# Patient Record
Sex: Female | Born: 1980 | Race: White | Hispanic: No | Marital: Married | Smoking: Former smoker
Health system: Southern US, Community
[De-identification: ages and names within clinical notes are randomized; demographics above are authoritative.]

## PROBLEM LIST (undated history)

## (undated) ENCOUNTER — Telehealth

## (undated) ENCOUNTER — Encounter

## (undated) ENCOUNTER — Ambulatory Visit: Payer: PRIVATE HEALTH INSURANCE

## (undated) ENCOUNTER — Ambulatory Visit: Payer: PRIVATE HEALTH INSURANCE | Attending: Family | Primary: Family

## (undated) ENCOUNTER — Ambulatory Visit

## (undated) ENCOUNTER — Encounter: Attending: Obstetrics & Gynecology | Primary: Obstetrics & Gynecology

## (undated) ENCOUNTER — Ambulatory Visit: Payer: PRIVATE HEALTH INSURANCE | Attending: Social Worker | Primary: Social Worker

## (undated) ENCOUNTER — Encounter: Attending: Podiatrist | Primary: Podiatrist

## (undated) ENCOUNTER — Encounter: Payer: PRIVATE HEALTH INSURANCE | Attending: Obstetrics & Gynecology | Primary: Obstetrics & Gynecology

## (undated) ENCOUNTER — Encounter: Payer: PRIVATE HEALTH INSURANCE | Attending: Clinical | Primary: Clinical

## (undated) ENCOUNTER — Ambulatory Visit: Attending: Family Medicine | Primary: Family Medicine

## (undated) ENCOUNTER — Telehealth: Attending: Podiatrist | Primary: Podiatrist

## (undated) ENCOUNTER — Encounter: Attending: Family | Primary: Family

## (undated) ENCOUNTER — Encounter: Attending: Anesthesiology | Primary: Anesthesiology

## (undated) ENCOUNTER — Ambulatory Visit: Payer: BLUE CROSS/BLUE SHIELD

## (undated) ENCOUNTER — Ambulatory Visit: Attending: Social Worker | Primary: Social Worker

## (undated) ENCOUNTER — Encounter: Payer: PRIVATE HEALTH INSURANCE | Attending: Medical | Primary: Medical

## (undated) ENCOUNTER — Ambulatory Visit: Payer: PRIVATE HEALTH INSURANCE | Attending: Medical | Primary: Medical

## (undated) ENCOUNTER — Telehealth: Attending: Clinical | Primary: Clinical

## (undated) ENCOUNTER — Ambulatory Visit: Payer: PRIVATE HEALTH INSURANCE | Attending: Podiatrist | Primary: Podiatrist

## (undated) ENCOUNTER — Ambulatory Visit: Payer: BLUE CROSS/BLUE SHIELD | Attending: Family Medicine | Primary: Family Medicine

## (undated) ENCOUNTER — Telehealth
Attending: Student in an Organized Health Care Education/Training Program | Primary: Student in an Organized Health Care Education/Training Program

## (undated) ENCOUNTER — Telehealth: Attending: Obstetrics & Gynecology | Primary: Obstetrics & Gynecology

## (undated) ENCOUNTER — Encounter: Attending: Social Worker | Primary: Social Worker

## (undated) ENCOUNTER — Encounter: Attending: Clinical | Primary: Clinical

## (undated) ENCOUNTER — Ambulatory Visit
Attending: Student in an Organized Health Care Education/Training Program | Primary: Student in an Organized Health Care Education/Training Program

## (undated) ENCOUNTER — Encounter: Payer: PRIVATE HEALTH INSURANCE | Attending: Podiatrist | Primary: Podiatrist

## (undated) DIAGNOSIS — F419 Anxiety disorder, unspecified: Secondary | ICD-10-CM

## (undated) DIAGNOSIS — M549 Dorsalgia, unspecified: Secondary | ICD-10-CM

## (undated) DIAGNOSIS — G8929 Other chronic pain: Secondary | ICD-10-CM

## (undated) HISTORY — PX: SPLENECTOMY, TOTAL: SHX788

---
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
## 2012-08-13 DIAGNOSIS — Z98891 History of uterine scar from previous surgery: Secondary | ICD-10-CM | POA: Insufficient documentation

## 2013-07-30 DIAGNOSIS — F419 Anxiety disorder, unspecified: Secondary | ICD-10-CM | POA: Insufficient documentation

## 2013-08-06 ENCOUNTER — Ambulatory Visit: Payer: Self-pay | Admitting: Physician Assistant

## 2014-03-17 ENCOUNTER — Ambulatory Visit: Payer: Self-pay

## 2014-03-17 LAB — PREGNANCY, URINE: PREGNANCY TEST, URINE: NEGATIVE m[IU]/mL

## 2014-08-07 ENCOUNTER — Ambulatory Visit: Payer: Self-pay | Admitting: Podiatry

## 2014-08-08 ENCOUNTER — Ambulatory Visit: Payer: Self-pay | Admitting: Podiatry

## 2014-10-19 NOTE — Op Note (Signed)
PATIENT NAME:  Julie Leach, Julie Leach MR#:  [id number] DATE OF BIRTH:  [birth year]  DATE OF PROCEDURE:  08/08/2014  SURGEON:  Ricci Barkerodd W Khairi Garman, DPM   PREOPERATIVE DIAGNOSIS: Hallux valgus deformity, left foot.   POSTOPERATIVE DIAGNOSIS: Hallux valgus deformity, left foot.   PROCEDURE: Hallux valgus correction, Austin type, left foot.   ANESTHESIA: Local MAC.   HEMOSTASIS: Pneumatic tourniquet left ankle, 250 mmHg.   ESTIMATED BLOOD LOSS: Minimal.   MATERIALS: One 2.2 mm, 22 mm length Medartis screw.   PATHOLOGY: None.   COMPLICATIONS: None apparent.   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 She elects for surgical correction of her deformity.   OPERATIVE PROCEDURE: The patient was taken to the Operating Room and placed on the table in the supine position. Following satisfactory sedation, the left foot was anesthetized with 10 mL of 0.5% Sensorcaine plain around the first metatarsal. A pneumatic tourniquet was applied at the level of the left ankle and the foot was prepped and draped in the usual sterile fashion. The foot was exsanguinated and the tourniquet inflated to 250 mmHg.   Attention was then directed to the dorsal aspect of the left foot where an approximate 4 cm linear incision was made coursing proximal to distal over the distal first metatarsal and metatarsophalangeal joint. The incision was deepened via sharp and blunt dissection down to the level of the joint where a linear capsulotomy was performed. The capsular and periosteal tissues were reflected off of the medial and dorsal aspect of the head and neck of the first metatarsal. Using a pneumatic saw, the prominent medial eminence was resected in toto as well as the dorsal prominence. Next, a 0.045 inch Leach wire was then driven from medial to lateral as an axis guide and a V-shaped osteotomy was performed through the head of the first metatarsal and the capital  fragment was freely mobilized and the pin was removed. The head of the metatarsal was then mobilized laterally and fixated using an intraoperative pin. Intraoperative Fluoroscan views revealed good reduction of the deformity. Next, a 2.2 mm 22 mm length Medartis screw was inserted for fixation of the osteotomy in standard fashion. Intraoperative views again revealed good placement of the screw and alignment of the bone. The redundant medial eminence was resected using a pneumatic saw and the edges were rasped smooth. The wound was flushed with copious amounts of sterile saline and closed in layers using a 4-0 running Vicryl suture for all layers from periosteal to deep and superficial subcutaneous closure followed by skin closure. Tincture of benzoin and Steri-Strips were applied to the incision followed by 4 x 4's and a sterile bandage. Next, a posterior splint was applied to the left foot and ankle with the foot 90 degrees relative to the leg. The patient tolerated the procedure and anesthesia well and was transported to the PACU with vital signs stable and in good condition.      ____________________________ Linus Galasodd Sarea Fyfe, DPM tc:at D: 08/08/2014 16:46:34 ET T: 08/08/2014 20:19:19 ET JOB#: [id number]  cc: Linus Galasodd Bhavesh Vazquez, DPM, <Dictator> Jahmai Finelli DPM ELECTRONICALLY SIGNED 08/20/2014 9:10

## 2015-03-03 ENCOUNTER — Ambulatory Visit
Admission: EM | Admit: 2015-03-03 | Discharge: 2015-03-03 | Disposition: A | Discharge status: Home / Self Care | Payer: No Typology Code available for payment source | Attending: Family Medicine | Admitting: Family Medicine

## 2015-03-03 DIAGNOSIS — G4441 Drug-induced headache, not elsewhere classified, intractable: Secondary | ICD-10-CM

## 2015-03-03 DIAGNOSIS — K625 Hemorrhage of anus and rectum: Secondary | ICD-10-CM | POA: Diagnosis not present

## 2015-03-03 DIAGNOSIS — G4440 Drug-induced headache, not elsewhere classified, not intractable: Secondary | ICD-10-CM

## 2015-03-03 DIAGNOSIS — R1084 Generalized abdominal pain: Secondary | ICD-10-CM

## 2015-03-03 HISTORY — DX: Other chronic pain: G89.29

## 2015-03-03 HISTORY — DX: Dorsalgia, unspecified: M54.9

## 2015-03-03 LAB — COMPREHENSIVE METABOLIC PANEL
ALBUMIN: 4.5 g/dL (ref 3.5–5.0)
ALT: 15 U/L (ref 14–54)
AST: 21 U/L (ref 15–41)
Alkaline Phosphatase: 45 U/L (ref 38–126)
Anion gap: 8 (ref 5–15)
BUN: 12 mg/dL (ref 6–20)
CHLORIDE: 101 mmol/L (ref 101–111)
CO2: 26 mmol/L (ref 22–32)
CREATININE: 0.6 mg/dL (ref 0.44–1.00)
Calcium: 9 mg/dL (ref 8.9–10.3)
GFR calc Af Amer: >60 mL/min (ref >60)
GFR calc non Af Amer: >60 mL/min (ref >60)
GLUCOSE: 90 mg/dL (ref 65–99)
POTASSIUM: 3.9 mmol/L (ref 3.5–5.1)
SODIUM: 135 mmol/L (ref 135–145)
Total Bilirubin: 0.4 mg/dL (ref 0.3–1.2)
Total Protein: 7.4 g/dL (ref 6.5–8.1)

## 2015-03-03 LAB — CBC WITH DIFFERENTIAL/PLATELET
BASOS ABS: 0.2 10*3/uL — AB (ref 0–0.1)
BASOS PCT: 1 %
EOS ABS: 0.6 10*3/uL (ref 0–0.7)
EOS PCT: 4 %
HCT: 40 % (ref 35.0–47.0)
Hemoglobin: 13.5 g/dL (ref 12.0–16.0)
Lymphocytes Relative: 29 %
Lymphs Abs: 4.3 10*3/uL — ABNORMAL HIGH (ref 1.0–3.6)
MCH: 31.2 pg (ref 26.0–34.0)
MCHC: 33.7 g/dL (ref 32.0–36.0)
MCV: 92.4 fL (ref 80.0–100.0)
MONO ABS: 1.4 10*3/uL — AB (ref 0.2–0.9)
Monocytes Relative: 9 %
Neutro Abs: 8.7 10*3/uL — ABNORMAL HIGH (ref 1.4–6.5)
Neutrophils Relative %: 57 %
PLATELETS: 386 10*3/uL (ref 150–440)
RBC: 4.33 MIL/uL (ref 3.80–5.20)
RDW: 13.5 % (ref 11.5–14.5)
WBC: 15.3 10*3/uL — ABNORMAL HIGH (ref 3.6–11.0)

## 2015-03-03 LAB — OCCULT BLOOD X 1 CARD TO LAB, STOOL: Fecal Occult Bld: NEGATIVE

## 2015-03-03 MED ORDER — ONDANSETRON HCL 4 MG/2ML IJ SOLN: 8.0000 mg | Freq: Once | INTRAMUSCULAR | Status: DC

## 2015-03-03 MED ORDER — KETOROLAC TROMETHAMINE 60 MG/2ML IM SOLN
60.0000 mg | Freq: Once | INTRAMUSCULAR | Status: AC
  Administered 2015-03-03: 60 mg via INTRAMUSCULAR

## 2015-03-03 MED ORDER — ONDANSETRON 8 MG PO TBDP
8.0000 mg | ORAL_TABLET | Freq: Once | ORAL | Status: AC
  Administered 2015-03-03: 8 mg via ORAL

## 2015-03-03 MED ORDER — CELECOXIB 400 MG PO CAPS: 400.0000 mg | ORAL_CAPSULE | Freq: Every day | ORAL | Status: DC

## 2015-03-03 MED ORDER — GI COCKTAIL ~~LOC~~
30.0000 mL | Freq: Once | ORAL | Status: AC
  Administered 2015-03-03: 30 mL via ORAL

## 2015-03-03 MED ORDER — ONDANSETRON 8 MG PO TBDP: 8.0000 mg | ORAL_TABLET | Freq: Three times a day (TID) | ORAL | Status: DC | PRN

## 2015-03-03 MED ORDER — SUCRALFATE 1 G PO TABS: 2.0000 g | ORAL_TABLET | Freq: Two times a day (BID) | ORAL | Status: DC

## 2015-03-03 NOTE — Discharge Instructions (Signed)
Abdominal Pain Many things can cause belly (abdominal) pain. Most times, the belly pain is not dangerous. Many cases of belly pain can be watched and treated at home. HOME CARE   Do not take medicines that help you go poop (laxatives) unless told to by your doctor.  Only take medicine as told by your doctor.  Eat or drink as told by your doctor. Your doctor will tell you if you should be on a special diet. GET HELP IF:  You do not know what is causing your belly pain.  You have belly pain while you are sick to your stomach (nauseous) or have runny poop (diarrhea).  You have pain while you pee or poop.  Your belly pain wakes you up at night.  You have belly pain that gets worse or better when you eat.  You have belly pain that gets worse when you eat fatty foods.  You have a fever. GET HELP RIGHT AWAY IF:   The pain does not go away within 2 hours.  You keep throwing up (vomiting).  The pain changes and is only in the right or left part of the belly.  You have bloody or tarry looking poop. MAKE SURE YOU:   Understand these instructions.  Will watch your condition.  Will get help right away if you are not doing well or get worse. Document Released: 11/23/2007 Document Revised: 06/11/2013 Document Reviewed: 02/13/2013 [facility] Patient Information 2015 [place], [place]. This information is not intended to replace advice given to you by your health care provider. Make sure you discuss any questions you have with your health care provider.  Gastrointestinal Bleeding Gastrointestinal bleeding is bleeding somewhere along the path that food travels through the body (digestive tract). This path is anywhere between the mouth and the opening of the butt (anus). You may have blood in your throw up (vomit) or in your poop (stools). If there is a lot of bleeding, you may need to stay in the hospital. HOME CARE  Only take medicine as told by your doctor.  Eat foods with fiber such as  whole grains, fruits, and vegetables. You can also try eating 1 to 3 prunes a day.  Drink enough fluids to keep your pee (urine) clear or pale yellow. GET HELP RIGHT AWAY IF:   Your bleeding gets worse.  You feel dizzy, weak, or you pass out (faint).  You have bad cramps in your back or belly (abdomen).  You have large blood clumps (clots) in your poop.  Your problems are getting worse. MAKE SURE YOU:   Understand these instructions.  Will watch your condition.  Will get help right away if you are not doing well or get worse. Document Released: 03/15/2008 Document Revised: 05/23/2012 Document Reviewed: 05/16/2011 [facility] Patient Information 2015 [place], [place]. This information is not intended to replace advice given to you by your health care provider. Make sure you discuss any questions you have with your health care provider.  Bloody Stools Bloody stools means there is blood in your poop (stool). It is a sign that there is a problem somewhere in the digestive system. It is important for your doctor to find the cause of your bleeding, so the problem can be treated.  HOME CARE  Only take medicine as told by your doctor.  Eat foods with fiber (prunes, bran cereals).  Drink enough fluids to keep your pee (urine) clear or pale yellow.  Sit in warm water (sitz bath) for 10 to 15 minutes  as told by your doctor.  Know how to take your medicines (enemas, suppositories) if advised by your doctor.  Watch for signs that you are getting better or getting worse. GET HELP RIGHT AWAY IF:   You are not getting better.  You start to get better but then get worse again.  You have new problems.  You have severe bleeding from the place where poop comes out (rectum) that does not stop.  You throw up (vomit) blood.  You feel weak or pass out (faint).  You have a fever. MAKE SURE YOU:   Understand these instructions.  Will watch your condition.  Will get help right away if  you are not doing well or get worse. Document Released: 05/25/2009 Document Revised: 08/29/2011 Document Reviewed: 10/22/2010 [facility] Patient Information 2015 [place], [place]. This information is not intended to replace advice given to you by your health care provider. Make sure you discuss any questions you have with your health care provider.

## 2015-03-03 NOTE — ED Notes (Signed)
Pt states "I have chronic back pain and take several different meds. I added the naproxen back to my regimen with Diclofenac. Today at 2 pm I had bad bowel cramping and a big bright red bloody stool. I called my PCP and they instructed me to go to the ED, but it was closer for me to come here to the urgent care." Pt instructed on differences in urgent care and ED, and the levels of care each can provide.

## 2015-03-03 NOTE — ED Provider Notes (Signed)
CSN: [id number]     Arrival date & time 03/03/15  1853 History   First MD Initiated Contact with Patient 03/03/15 2108     Chief Complaint  Patient presents with  . Rectal Bleeding   patient states that she started having rectal bleeding this afternoon. She has history of back pain back trouble and she's been placed on Naprosyn which has not been effective she had Voltaren added to her regimen. When the Voltaren failed to help she started taking the Naprosyn with the Voltaren. She states she started having cramping this afternoon went to the bathroom had what she thought was going to be a diarrhea stool but was only blood and a significant amount blood that came out of her rectum. She called her PCP who recommended her go to the ED but instead she came here for evaluation and treatment. She has history of chronic back pain is on other medications for that as well. She states that she doesn't want to have another bowel movement right now place been having cramping sensation all over her abdomen. She had a history of anemia years ago was placed on but does know what her hemoglobin hematocrit is now always been in the past when she was anemic. (Consider location/radiation/quality/duration/timing/severity/associated sxs/prior Treatment) Patient is a 34 y.o. female presenting with hematochezia. The history is provided by the patient. No language interpreter was used.  Rectal Bleeding Quality:  Bright red Amount:  Moderate Progression:  Unchanged Chronicity:  New Context: defecation and spontaneously   Context: not anal fissures, not anal penetration, not diarrhea, not foreign body, not hemorrhoids, not rectal injury and not rectal pain   Similar prior episodes: no   Relieved by:  Nothing Ineffective treatments:  None tried Associated symptoms: abdominal pain, dizziness, epistaxis and recent illness   Associated symptoms: no fever, no hematemesis, no light-headedness, no loss of consciousness and no  vomiting   Risk factors: NSAID use   Risk factors: no anticoagulant use, no hx of IBD and no steroid use     Past Medical History  Diagnosis Date  . Chronic back pain    Past Surgical History  Procedure Laterality Date  . Cesarean section    . Splenectomy, total     No family history on file. Social History  Substance Use Topics  . Smoking status: Never Smoker   . Smokeless tobacco: None  . Alcohol Use: Yes   OB History    No data available     Review of Systems  Constitutional: Negative for fever and activity change.  HENT: Positive for nosebleeds.   Gastrointestinal: Positive for abdominal pain, blood in stool, hematochezia and abdominal distention. Negative for nausea, vomiting, diarrhea, constipation, rectal pain and hematemesis.  Musculoskeletal: Positive for myalgias and back pain. Negative for neck pain and neck stiffness.  Neurological: Positive for dizziness. Negative for loss of consciousness and light-headedness.  All other systems reviewed and are negative.  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 She is also has C-sections. She does not smoke but she does drink alcohol.  Allergies  Mobic and Morphine and related  Home Medications   Prior to Admission medications   Medication Sig Start Date End Date Taking? Authorizing Provider  diclofenac (VOLTAREN) 50 MG EC tablet Take 50 mg by mouth 2 (two) times daily.   Yes Historical Provider, MD  gabapentin (NEURONTIN) 300 MG capsule Take 300 mg by mouth 3 (three) times daily.   Yes Historical Provider, MD  naproxen (EC NAPROSYN) 500 MG EC tablet Take 500 mg by mouth 2 (two) times daily with a meal.   Yes Historical Provider, MD  sertraline (ZOLOFT) 100 MG tablet Take 100 mg by mouth daily.   Yes Historical Provider, MD   Meds Ordered and Administered this Visit   Medications  gi cocktail (Maalox,Lidocaine,Donnatal) (30 mLs Oral Given 03/03/15 2128)    BP 149/86 mmHg  Pulse 67  Temp(Src) 98.2  F (36.8 C) (Oral)  Resp 16  Ht  (1.575 m)  Wt 150 lb (68.04 kg)  BMI 27.43 kg/m2  SpO2 100%  LMP 08/31/2014 (Exact Date) No data found.   Physical Exam  Constitutional: She is oriented to person, place, and time. She appears well-developed and well-nourished.  HENT:  Head: Normocephalic and atraumatic.  Eyes: Pupils are equal, round, and reactive to light.  Conjunctiva is very pale.  Neck: Normal range of motion. Neck supple. No tracheal deviation present. No thyromegaly present.  Cardiovascular: Normal rate, regular rhythm and normal heart sounds.  Exam reveals no friction rub.   No murmur heard. Pulmonary/Chest: Effort normal and breath sounds normal. No respiratory distress. She has no wheezes.  Abdominal: Soft. She exhibits no distension. Bowel sounds are decreased. There is no hepatomegaly. There is generalized tenderness. There is no rebound and no CVA tenderness.    Midline abdominal scar.  Genitourinary: Rectum normal. Rectal exam shows no external hemorrhoid, no internal hemorrhoid, no fissure, no mass, no tenderness and anal tone normal. Guaiac negative stool.  Musculoskeletal: Normal range of motion. She exhibits no edema or tenderness.  Neurological: She is alert and oriented to person, place, and time. She has normal reflexes.  Skin: Skin is warm and dry. No erythema.  Psychiatric: She has a normal mood and affect.  Vitals reviewed.   ED Course  Procedures (including critical care time)  Labs Review Labs Reviewed  CBC WITH DIFFERENTIAL/PLATELET - Abnormal; Notable for the following:    WBC 15.3 (*)    Neutro Abs 8.7 (*)    Lymphs Abs 4.3 (*)    Monocytes Absolute 1.4 (*)    Basophils Absolute 0.2 (*)    All other components within normal limits  COMPREHENSIVE METABOLIC PANEL  OCCULT BLOOD X 1 CARD TO LAB, STOOL    Imaging Review No results found.   Visual Acuity Review  Right Eye Distance:   Left Eye Distance:   Bilateral Distance:     Right Eye Near:   Left Eye Near:    Bilateral Near:         MDM   1. Rash   2. Viral disease characterized by exanthem     Conjunctiva was initially pale but hemoglobin and hematocrit stable. Explained to patient that I'll place on Carafate 2 tablets twice a day. I'm concerned about her taking both Naprosyn and Voltaren together so I'll recommend Celebrex 400 mg 1 capsule a day. Zofran 8 mg for nausea case it recurs work note for today and tomorrow. Explained patient at the rectal bleeding restarts she will need to go to the emergency room of her choice otherwise follow-up with her PCP in about a week.  Last an inflammatory was this morning she states that she's having trouble for back now and wants to which she can use for her back . All local pharmacies will be closed now. Will administer 60 mg Toradol IM for back pain and reiterate again to go to the ED if bleeding starts again.  Hassan Rowan, MD 03/03/15 2213

## 2015-03-13 ENCOUNTER — Other Ambulatory Visit: Payer: Self-pay | Admitting: Orthopedic Surgery

## 2015-03-13 DIAGNOSIS — M75101 Unspecified rotator cuff tear or rupture of right shoulder, not specified as traumatic: Secondary | ICD-10-CM

## 2015-03-13 DIAGNOSIS — M25511 Pain in right shoulder: Secondary | ICD-10-CM

## 2015-03-23 ENCOUNTER — Ambulatory Visit: Payer: No Typology Code available for payment source

## 2015-03-23 ENCOUNTER — Ambulatory Visit
Admission: RE | Admit: 2015-03-23 | Discharge: 2015-03-23 | Disposition: A | Discharge status: Home / Self Care | Payer: No Typology Code available for payment source | Source: Ambulatory Visit | Attending: Orthopedic Surgery | Admitting: Orthopedic Surgery

## 2015-03-23 DIAGNOSIS — M75101 Unspecified rotator cuff tear or rupture of right shoulder, not specified as traumatic: Secondary | ICD-10-CM | POA: Insufficient documentation

## 2015-03-23 DIAGNOSIS — M25511 Pain in right shoulder: Secondary | ICD-10-CM | POA: Insufficient documentation

## 2015-03-31 DIAGNOSIS — M75101 Unspecified rotator cuff tear or rupture of right shoulder, not specified as traumatic: Secondary | ICD-10-CM | POA: Insufficient documentation

## 2015-03-31 DIAGNOSIS — Z8739 Personal history of other diseases of the musculoskeletal system and connective tissue: Secondary | ICD-10-CM | POA: Insufficient documentation

## 2015-08-26 ENCOUNTER — Other Ambulatory Visit: Payer: Self-pay | Admitting: Pediatrics

## 2015-08-26 DIAGNOSIS — N63 Unspecified lump in unspecified breast: Secondary | ICD-10-CM

## 2015-09-11 ENCOUNTER — Ambulatory Visit
Admission: RE | Admit: 2015-09-11 | Discharge: 2015-09-11 | Disposition: A | Discharge status: Home / Self Care | Payer: BLUE CROSS/BLUE SHIELD | Source: Ambulatory Visit | Attending: Pediatrics | Admitting: Pediatrics

## 2015-09-11 DIAGNOSIS — N63 Unspecified lump in unspecified breast: Secondary | ICD-10-CM

## 2016-07-11 ENCOUNTER — Other Ambulatory Visit: Payer: BLUE CROSS/BLUE SHIELD

## 2016-07-15 ENCOUNTER — Ambulatory Visit: Admission: RE | Admit: 2016-07-15 | Payer: BLUE CROSS/BLUE SHIELD | Source: Ambulatory Visit | Admitting: Podiatry

## 2016-07-15 ENCOUNTER — Encounter: Admission: RE | Payer: Self-pay | Source: Ambulatory Visit

## 2016-07-15 SURGERY — CORRECTION, HALLUX VALGUS
Anesthesia: Choice | Laterality: Right

## 2016-10-04 DIAGNOSIS — L280 Lichen simplex chronicus: Secondary | ICD-10-CM | POA: Insufficient documentation

## 2016-10-04 DIAGNOSIS — G8929 Other chronic pain: Secondary | ICD-10-CM | POA: Insufficient documentation

## 2017-02-10 ENCOUNTER — Ambulatory Visit: Admission: RE | Admit: 2017-02-10 | Discharge: 2017-02-10 | Payer: PRIVATE HEALTH INSURANCE

## 2017-02-10 DIAGNOSIS — M5416 Radiculopathy, lumbar region: Principal | ICD-10-CM

## 2017-02-10 MED ORDER — PREDNISONE 20 MG TABLET
ORAL_TABLET | 0 refills | 0 days | Status: CP
Start: 2017-02-10 — End: 2017-04-05

## 2017-02-10 MED ORDER — HYDROCODONE 5 MG-ACETAMINOPHEN 325 MG TABLET
ORAL_TABLET | ORAL | 0 refills | 0.00000 days | Status: CP | PRN
Start: 2017-02-10 — End: 2017-04-05

## 2017-04-05 ENCOUNTER — Ambulatory Visit: Admission: RE | Admit: 2017-04-05 | Discharge: 2017-04-05 | Payer: PRIVATE HEALTH INSURANCE

## 2017-04-05 DIAGNOSIS — Z8742 Personal history of other diseases of the female genital tract: Principal | ICD-10-CM

## 2017-04-05 DIAGNOSIS — G8929 Other chronic pain: Secondary | ICD-10-CM

## 2017-04-05 DIAGNOSIS — Z9081 Acquired absence of spleen: Secondary | ICD-10-CM

## 2017-04-05 DIAGNOSIS — Z23 Encounter for immunization: Secondary | ICD-10-CM

## 2017-04-05 DIAGNOSIS — M25511 Pain in right shoulder: Secondary | ICD-10-CM

## 2017-04-12 ENCOUNTER — Ambulatory Visit: Admit: 2017-04-12 | Discharge: 2017-04-12 | Payer: BLUE CROSS/BLUE SHIELD

## 2017-04-12 DIAGNOSIS — G8929 Other chronic pain: Secondary | ICD-10-CM

## 2017-04-12 DIAGNOSIS — M7581 Other shoulder lesions, right shoulder: Principal | ICD-10-CM

## 2017-04-12 DIAGNOSIS — M25511 Pain in right shoulder: Secondary | ICD-10-CM

## 2017-07-05 ENCOUNTER — Encounter: Admit: 2017-07-05 | Discharge: 2017-07-06 | Payer: PRIVATE HEALTH INSURANCE

## 2017-07-05 DIAGNOSIS — R109 Unspecified abdominal pain: Secondary | ICD-10-CM

## 2017-07-05 DIAGNOSIS — Z975 Presence of (intrauterine) contraceptive device: Secondary | ICD-10-CM

## 2017-07-05 DIAGNOSIS — R110 Nausea: Principal | ICD-10-CM

## 2017-07-05 DIAGNOSIS — N920 Excessive and frequent menstruation with regular cycle: Secondary | ICD-10-CM

## 2017-07-05 MED ORDER — HYDROXYZINE HCL 25 MG TABLET
ORAL_TABLET | Freq: Three times a day (TID) | ORAL | 0 refills | 0.00000 days | Status: CP | PRN
Start: 2017-07-05 — End: 2017-07-31

## 2017-07-05 MED ORDER — ONDANSETRON 8 MG DISINTEGRATING TABLET
ORAL_TABLET | Freq: Every day | ORAL | 0 refills | 0.00000 days | Status: CP | PRN
Start: 2017-07-05 — End: 2018-01-04

## 2017-07-05 MED ORDER — GABAPENTIN 300 MG CAPSULE
ORAL_CAPSULE | Freq: Every evening | ORAL | 0 refills | 0 days | Status: CP
Start: 2017-07-05 — End: 2017-10-03

## 2017-07-05 MED ORDER — TRIAMCINOLONE ACETONIDE 0.1 % TOPICAL OINTMENT
Freq: Two times a day (BID) | TOPICAL | 2 refills | 0 days | Status: CP
Start: 2017-07-05 — End: 2018-07-05

## 2017-07-05 MED ORDER — SERTRALINE 100 MG TABLET
ORAL_TABLET | Freq: Every day | ORAL | 1 refills | 0.00000 days | Status: CP
Start: 2017-07-05 — End: 2018-01-10

## 2017-07-31 MED ORDER — HYDROXYZINE HCL 25 MG TABLET
ORAL_TABLET | Freq: Three times a day (TID) | ORAL | 0 refills | 0.00000 days | Status: CP | PRN
Start: 2017-07-31 — End: 2018-07-31

## 2017-08-03 ENCOUNTER — Encounter: Admit: 2017-08-03 | Discharge: 2017-08-04 | Payer: PRIVATE HEALTH INSURANCE | Attending: Family | Primary: Family

## 2017-08-03 DIAGNOSIS — M549 Dorsalgia, unspecified: Principal | ICD-10-CM

## 2017-08-03 DIAGNOSIS — R1011 Right upper quadrant pain: Secondary | ICD-10-CM

## 2017-08-03 DIAGNOSIS — R1031 Right lower quadrant pain: Secondary | ICD-10-CM

## 2017-10-03 ENCOUNTER — Ambulatory Visit: Admit: 2017-10-03 | Discharge: 2017-10-04 | Payer: PRIVATE HEALTH INSURANCE

## 2017-10-03 DIAGNOSIS — L709 Acne, unspecified: Secondary | ICD-10-CM

## 2017-10-03 DIAGNOSIS — R87619 Unspecified abnormal cytological findings in specimens from cervix uteri: Secondary | ICD-10-CM

## 2017-10-03 DIAGNOSIS — R109 Unspecified abdominal pain: Secondary | ICD-10-CM

## 2017-10-03 DIAGNOSIS — M2550 Pain in unspecified joint: Secondary | ICD-10-CM

## 2017-10-03 DIAGNOSIS — F419 Anxiety disorder, unspecified: Secondary | ICD-10-CM

## 2017-10-03 DIAGNOSIS — Z975 Presence of (intrauterine) contraceptive device: Principal | ICD-10-CM

## 2017-10-03 MED ORDER — RANITIDINE 150 MG TABLET
ORAL_TABLET | Freq: Two times a day (BID) | ORAL | 2 refills | 0 days | Status: CP
Start: 2017-10-03 — End: 2018-10-01

## 2017-10-03 MED ORDER — DOXYCYCLINE HYCLATE 100 MG CAPSULE
ORAL_CAPSULE | Freq: Every day | ORAL | 2 refills | 0.00000 days | Status: CP
Start: 2017-10-03 — End: 2017-10-13

## 2017-10-03 MED ORDER — GABAPENTIN 300 MG CAPSULE
ORAL_CAPSULE | 2 refills | 0 days | Status: CP
Start: 2017-10-03 — End: 2018-07-19

## 2017-10-03 MED ORDER — ADAPALENE 0.1 %-BENZOYL PEROXIDE 2.5 % TOPICAL GEL WITH PUMP
Freq: Every day | TOPICAL | 2 refills | 0.00000 days | Status: CP
Start: 2017-10-03 — End: 2017-10-13

## 2017-10-13 MED ORDER — TRETINOIN 0.01 % TOPICAL GEL
Freq: Every evening | TOPICAL | 0 refills | 0 days | Status: CP
Start: 2017-10-13 — End: 2018-01-15

## 2017-10-13 MED ORDER — CLINDAMYCIN 1.2 % (1 % BASE)-BENZOYL PEROXIDE 5 % TOPICAL GEL
2 refills | 0 days | Status: CP
Start: 2017-10-13 — End: 2018-11-19

## 2018-01-09 ENCOUNTER — Encounter: Admit: 2018-01-09 | Discharge: 2018-01-10 | Payer: PRIVATE HEALTH INSURANCE

## 2018-01-09 DIAGNOSIS — G8929 Other chronic pain: Secondary | ICD-10-CM

## 2018-01-09 DIAGNOSIS — Z9081 Acquired absence of spleen: Secondary | ICD-10-CM

## 2018-01-09 DIAGNOSIS — M549 Dorsalgia, unspecified: Secondary | ICD-10-CM

## 2018-01-09 DIAGNOSIS — R1110 Vomiting, unspecified: Secondary | ICD-10-CM

## 2018-01-09 DIAGNOSIS — F419 Anxiety disorder, unspecified: Principal | ICD-10-CM

## 2018-01-09 DIAGNOSIS — R110 Nausea: Secondary | ICD-10-CM

## 2018-01-09 DIAGNOSIS — K219 Gastro-esophageal reflux disease without esophagitis: Secondary | ICD-10-CM

## 2018-01-09 DIAGNOSIS — L709 Acne, unspecified: Secondary | ICD-10-CM

## 2018-01-09 MED ORDER — ONDANSETRON 8 MG DISINTEGRATING TABLET
ORAL_TABLET | Freq: Every day | ORAL | 2 refills | 0 days | Status: CP | PRN
Start: 2018-01-09 — End: 2018-11-05

## 2018-01-09 MED ORDER — GABAPENTIN 100 MG CAPSULE
ORAL_CAPSULE | 3 refills | 0 days | Status: CP
Start: 2018-01-09 — End: 2018-07-19

## 2018-01-12 MED ORDER — SERTRALINE 100 MG TABLET
ORAL_TABLET | Freq: Every day | ORAL | 1 refills | 0.00000 days | Status: CP
Start: 2018-01-12 — End: 2018-07-19

## 2018-01-15 ENCOUNTER — Ambulatory Visit: Admit: 2018-01-15 | Discharge: 2018-01-16 | Payer: PRIVATE HEALTH INSURANCE | Attending: Medical | Primary: Medical

## 2018-01-15 DIAGNOSIS — K219 Gastro-esophageal reflux disease without esophagitis: Secondary | ICD-10-CM

## 2018-01-15 DIAGNOSIS — R1110 Vomiting, unspecified: Secondary | ICD-10-CM

## 2018-01-15 DIAGNOSIS — R1310 Dysphagia, unspecified: Secondary | ICD-10-CM

## 2018-01-15 DIAGNOSIS — R110 Nausea: Principal | ICD-10-CM

## 2018-01-22 DIAGNOSIS — R1310 Dysphagia, unspecified: Principal | ICD-10-CM

## 2018-01-24 ENCOUNTER — Encounter: Admit: 2018-01-24 | Discharge: 2018-01-24 | Payer: PRIVATE HEALTH INSURANCE

## 2018-01-24 ENCOUNTER — Ambulatory Visit: Admit: 2018-01-24 | Discharge: 2018-01-24 | Payer: PRIVATE HEALTH INSURANCE

## 2018-01-24 DIAGNOSIS — R1310 Dysphagia, unspecified: Principal | ICD-10-CM

## 2018-01-25 ENCOUNTER — Encounter: Admit: 2018-01-25 | Discharge: 2018-01-26 | Payer: PRIVATE HEALTH INSURANCE

## 2018-01-25 DIAGNOSIS — R1310 Dysphagia, unspecified: Principal | ICD-10-CM

## 2018-01-25 DIAGNOSIS — K219 Gastro-esophageal reflux disease without esophagitis: Secondary | ICD-10-CM

## 2018-02-19 ENCOUNTER — Encounter
Admit: 2018-02-19 | Discharge: 2018-02-19 | Disposition: A | Discharge status: Home / Self Care | Payer: PRIVATE HEALTH INSURANCE

## 2018-02-19 ENCOUNTER — Encounter: Payer: Self-pay | Admitting: Gynecology

## 2018-02-19 ENCOUNTER — Ambulatory Visit
Admission: EM | Admit: 2018-02-19 | Discharge: 2018-02-19 | Disposition: A | Discharge status: Other Acute Inpatient Hospital | Payer: BLUE CROSS/BLUE SHIELD | Attending: Family Medicine | Admitting: Family Medicine

## 2018-02-19 ENCOUNTER — Other Ambulatory Visit: Payer: Self-pay

## 2018-02-19 DIAGNOSIS — R1031 Right lower quadrant pain: Secondary | ICD-10-CM | POA: Diagnosis not present

## 2018-02-19 HISTORY — DX: Anxiety disorder, unspecified: F41.9

## 2018-02-19 LAB — URINALYSIS, COMPLETE (UACMP) WITH MICROSCOPIC
BACTERIA UA: NONE SEEN
Bilirubin Urine: NEGATIVE
GLUCOSE, UA: NEGATIVE mg/dL
Ketones, ur: NEGATIVE mg/dL
Leukocytes, UA: NEGATIVE
NITRITE: NEGATIVE
Protein, ur: NEGATIVE mg/dL
RBC / HPF: NONE SEEN RBC/hpf (ref 0–5)
SPECIFIC GRAVITY, URINE: 1.015 (ref 1.005–1.030)
pH: 6.5 (ref 5.0–8.0)

## 2018-02-19 MED ORDER — ONDANSETRON 4 MG PO TBDP
4.0000 mg | ORAL_TABLET | Freq: Once | ORAL | Status: AC
  Administered 2018-02-19: 4 mg via ORAL

## 2018-02-19 NOTE — ED Triage Notes (Signed)
Per patient dull pain at right mid back x 6 days.  Per  Patient since pain having constipation issue and not urinating often. Patient also stated has been working out lately,

## 2018-02-19 NOTE — ED Provider Notes (Signed)
[facility] ____________________________________________  Time seen: Approximately 10:27 AM  I have reviewed the triage vital signs and the nursing notes.   HISTORY  Chief Complaint Back Pain   HPI Julie Leach is a 37 y.o. female presenting for evaluation of right flank and right abdominal pain present for the last 6 days.  Patient described pain as a dull aching pain that has been constant but intermittently gets worse.  States today she has felt like someone has been stabbing her in her right abdomen with increased pain.  States nausea accompanying, but states that she has intermittent nausea anyways.  Has had slight constipation in the last few days but has been continuously having bowel movements.  Denies dysuria.  No vaginal complaints.  Denies concerns of pregnancy.  Previous splenectomy and 2 cesareans.  Denies other abdominal surgeries.  Denies aggravating or alleviating factors.  Reports otherwise feeling well.  No chest pain or shortness of breath.  Denies recent sickness. Denies recent antibiotic use.   No LMP recorded. (Menstrual status: IUD).  Cyndie Mull, DO: PCP   Past Medical History:  Diagnosis Date  . Anxiety   . Chronic back pain     There are no active problems to display for this patient.   Past Surgical History:  Procedure Laterality Date  . CESAREAN SECTION    . SPLENECTOMY, TOTAL       No current facility-administered medications for this encounter.   Current Outpatient Medications:  .  gabapentin (NEURONTIN) 300 MG capsule, Take 300 mg by mouth 3 (three) times daily., Disp: , Rfl:  .  levonorgestrel (MIRENA) 20 MCG/24HR IUD, 1 each by Intrauterine route once., Disp: , Rfl:  .  ondansetron (ZOFRAN ODT) 8 MG disintegrating tablet, Take 1 tablet (8 mg total) by mouth every 8 (eight) hours as needed for nausea or vomiting., Disp: 20 tablet, Rfl: 0 .  sertraline (ZOLOFT) 100 MG tablet, Take 100 mg by mouth daily., Disp:  , Rfl:   Allergies Mobic [meloxicam]; Morphine and related; and Latex  Family History  Problem Relation Age of Onset  . Cancer Mother 68       unknown primary bones,lung,and liver    Social History Social History   Tobacco Use  . Smoking status: Former Smoker    Last attempt to quit: 02/20/2008    Years since quitting: 10.0  . Smokeless tobacco: Never Used  Substance Use Topics  . Alcohol use: Yes  . Drug use: Never    Review of Systems Constitutional: No fever/chills Cardiovascular: Denies chest pain. Respiratory: Denies shortness of breath. Gastrointestinal: as above.  Genitourinary: Negative for dysuria. Musculoskeletal: Negative for back pain. Skin: Negative for rash.   ____________________________________________   PHYSICAL EXAM:  VITAL SIGNS: ED Triage Vitals  Enc Vitals Group     BP 02/19/18 0930 114/72     Pulse Rate 02/19/18 0930 60     Resp 02/19/18 0930 16     Temp 02/19/18 0930 98 F (36.7 C)     Temp Source 02/19/18 0930 Oral     SpO2 02/19/18 0930 99 %     Weight 02/19/18 0929 148 lb (67.1 kg)     Height 02/19/18 0929 5\' 2"  (1.575 m)     Head Circumference --      Peak Flow --      Pain Score 02/19/18 0929 3     Pain Loc --      Pain Edu? --  Excl. in GC? --     Constitutional: Alert and oriented. Well appearing and in no acute distress. ENT      Head: Normocephalic and atraumatic. Cardiovascular: Normal rate, regular rhythm. Grossly normal heart sounds.  Good peripheral circulation. Respiratory: Normal respiratory effort without tachypnea nor retractions. Breath sounds are clear and equal bilaterally. No wheezes, rales, rhonchi. Gastrointestinal:  No distention. Normal Bowel sounds.  Moderate right lower abdominal tenderness at McBurney's point.  Abdomen otherwise soft and nontender.  No CVA tenderness.  Negative obturator sign. Musculoskeletal:  No midline cervical, thoracic or lumbar tenderness to palpation.  Neurologic:  Normal  speech and language. Speech is normal. No gait instability.  Skin:  Skin is warm, dry  Psychiatric: Mood and affect are normal. Speech and behavior are normal. Patient exhibits appropriate insight and judgment   ___________________________________________   LABS (all labs ordered are listed, but only abnormal results are displayed)  Labs Reviewed  URINALYSIS, COMPLETE (UACMP) WITH MICROSCOPIC - Abnormal; Notable for the following components:      Result Value   Hgb urine dipstick TRACE (*)    All other components within normal limits     PROCEDURES Procedures    INITIAL IMPRESSION / ASSESSMENT AND PLAN / ED COURSE  Pertinent labs & imaging results that were available during my care of the patient were reviewed by me and considered in my medical decision making (see chart for details).  Patient with moderate right lower quadrant abdominal tenderness at McBurney's point and patient very uncomfortable at this time with difficulty sitting still.  Patient right upper quadrant right flank nontender on exam.  Urinalysis with trace hemoglobin otherwise unremarkable.  Concern with appendicitis.  Discussed evaluating a CT in urgent care however patient expressed increased pain and states that she would like to go into the ER at this time.  Patient called her husband and he will take her to [facility] ER.  Patient stable at this time.  Patient verbalized understanding and agrees to this plan.  4mg  ODT Zofran given prior to discharge.    ____________________________________________   FINAL CLINICAL IMPRESSION(S) / ED DIAGNOSES  Final diagnoses:  Right lower quadrant abdominal pain     ED Discharge Orders    None       Note: This dictation was prepared with Dragon dictation along with smaller phrase technology. Any transcriptional errors that result from this process are unintentional.         Renford Dills, NP 02/19/18 1247

## 2018-02-19 NOTE — Discharge Instructions (Addendum)
Go directly to emergency room as discussed.  °

## 2018-02-26 IMAGING — US US BREAST COMPLETE UNI RIGHT INC AXILLA
1 series · 1 of 1 positions shown · non-contrast
Comparison: No previous exams.

CLINICAL DATA: Palpable lump within the right breast at the 8
o'clock axis region for 1 month.

This is patient's baseline mammogram.
EXAM:
2D DIGITAL DIAGNOSTIC BILATERAL MAMMOGRAM WITH ADJUNCT TOMO
ULTRASOUND RIGHT BREAST

[Series 2: us breast complete uni right inc axilla · 0.06mm/px · 1 of 1 slices shown]
[im 1/1]
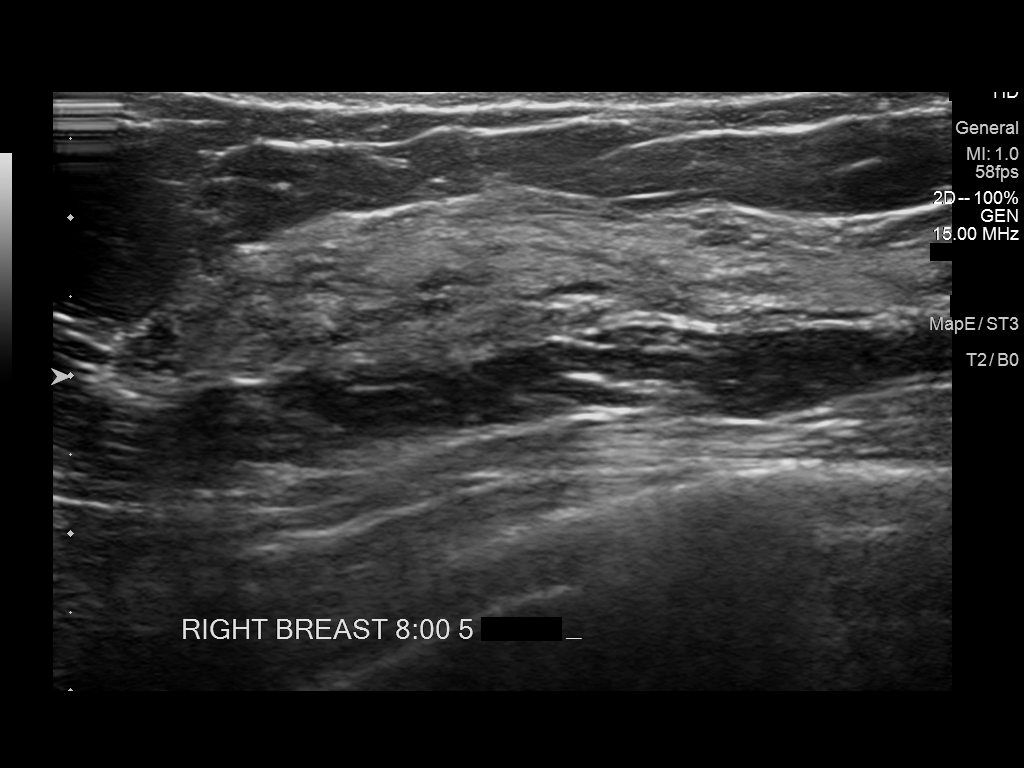

[1 of 1 positions shown; findings below may reference images not displayed]

This is a baseline mammogram.

ACR Breast Density Category c: The breast tissue is heterogeneously
dense, which may obscure small masses.
FINDINGS: Bilateral 2D CC and MLO views were obtained, with additional 3D
tomosynthesis. Additional tangential view of the right breast, with
spot compression and 3D tomosynthesis, was obtained for the area of
clinical concern.

There are no dominant masses, suspicious calcifications or secondary
signs of malignancy within either breast. Specifically, there is no
mammographic abnormality within the outer right breast corresponding
to the area of clinical concern.

On physical exam, there is vague soft tissue thickening within the
outer right breast without evidence of circumscribed mass.

Targeted ultrasound is performed, evaluating the outer right breast
with particular attention to the 8 o'clock axis region, showing only
normal fibroglandular tissues and fat lobules throughout. No
suspicious solid or cystic masses are identified by ultrasound.
IMPRESSION: No evidence of malignancy within either breast. Specifically, no
mammographic or sonographic evidence of malignancy within the outer
right breast corresponding to the area of clinical concern. Ridge of
normal dense fibroglandular tissue corresponding to the palpable
abnormality.

RECOMMENDATION:
Screening mammogram at age 39 unless there are persistent or
intervening clinical concerns. (Code:AH-I-NM5)

The patient was instructed to return sooner if the area of clinical
concern becomes larger and/or firmer to palpation.

Patient claims a family history of breast cancer in her mother.
Patient also claims a family history of additional other cancers.
Given patient's family history, would consider performing a
calculation of breast cancer risk which may lead to the addition of
annual screening breast MRI to annual screening mammography starting
this year. Per [employer] guidelines, annual screening
MRI of the breasts is recommended if a risk assessment calculation
for breast cancer, preferably using the Tyrer-Cuzick model, measures
greater than 20%.

I have discussed the findings and recommendations with the patient.
Results were also provided in writing at the conclusion of the
visit. If applicable, a reminder letter will be sent to the patient
regarding the next appointment.

BI-RADS CATEGORY  1: Negative.

## 2018-07-19 MED ORDER — GABAPENTIN 300 MG CAPSULE
ORAL_CAPSULE | 2 refills | 0 days | Status: CP
Start: 2018-07-19 — End: 2018-11-16

## 2018-07-20 MED ORDER — SERTRALINE 100 MG TABLET
ORAL_TABLET | Freq: Every day | ORAL | 1 refills | 0.00000 days | Status: CP
Start: 2018-07-20 — End: 2019-07-21

## 2018-08-09 ENCOUNTER — Encounter: Admit: 2018-08-09 | Discharge: 2018-08-10 | Payer: PRIVATE HEALTH INSURANCE | Attending: Family | Primary: Family

## 2018-08-09 DIAGNOSIS — S4992XA Unspecified injury of left shoulder and upper arm, initial encounter: Principal | ICD-10-CM

## 2018-08-09 DIAGNOSIS — Z0000 Encounter for general adult medical examination without abnormal findings: Secondary | ICD-10-CM

## 2018-08-09 MED ORDER — DICLOFENAC SODIUM 75 MG TABLET,DELAYED RELEASE
ORAL_TABLET | Freq: Two times a day (BID) | ORAL | 1 refills | 0 days | Status: CP
Start: 2018-08-09 — End: 2018-11-19

## 2018-08-09 MED ORDER — CYCLOBENZAPRINE 5 MG TABLET
ORAL_TABLET | Freq: Two times a day (BID) | ORAL | 1 refills | 0 days | Status: CP | PRN
Start: 2018-08-09 — End: 2018-11-05

## 2018-09-07 ENCOUNTER — Encounter
Admit: 2018-09-07 | Discharge: 2018-09-07 | Disposition: A | Discharge status: Home / Self Care | Payer: PRIVATE HEALTH INSURANCE

## 2018-09-07 DIAGNOSIS — F419 Anxiety disorder, unspecified: Principal | ICD-10-CM

## 2018-09-07 DIAGNOSIS — R936 Abnormal findings on diagnostic imaging of limbs: Principal | ICD-10-CM

## 2018-09-07 DIAGNOSIS — Z043 Encounter for examination and observation following other accident: Principal | ICD-10-CM

## 2018-09-07 DIAGNOSIS — R208 Other disturbances of skin sensation: Principal | ICD-10-CM

## 2018-09-07 DIAGNOSIS — S4990XA Unspecified injury of shoulder and upper arm, unspecified arm, initial encounter: Principal | ICD-10-CM

## 2018-09-07 DIAGNOSIS — Z888 Allergy status to other drugs, medicaments and biological substances status: Principal | ICD-10-CM

## 2018-09-07 DIAGNOSIS — S80811A Abrasion, right lower leg, initial encounter: Principal | ICD-10-CM

## 2018-09-07 DIAGNOSIS — Z87891 Personal history of nicotine dependence: Principal | ICD-10-CM

## 2018-09-07 DIAGNOSIS — Z91040 Latex allergy status: Principal | ICD-10-CM

## 2018-09-07 DIAGNOSIS — S50311A Abrasion of right elbow, initial encounter: Principal | ICD-10-CM

## 2018-09-07 DIAGNOSIS — D72829 Elevated white blood cell count, unspecified: Principal | ICD-10-CM

## 2018-09-07 DIAGNOSIS — S50819A Abrasion of unspecified forearm, initial encounter: Principal | ICD-10-CM

## 2018-09-07 DIAGNOSIS — S51011A Laceration without foreign body of right elbow, initial encounter: Principal | ICD-10-CM

## 2018-09-07 DIAGNOSIS — N83209 Unspecified ovarian cyst, unspecified side: Principal | ICD-10-CM

## 2018-09-07 DIAGNOSIS — M719 Bursopathy, unspecified: Principal | ICD-10-CM

## 2018-09-07 DIAGNOSIS — S8990XA Unspecified injury of unspecified lower leg, initial encounter: Principal | ICD-10-CM

## 2018-09-07 DIAGNOSIS — D649 Anemia, unspecified: Principal | ICD-10-CM

## 2018-09-07 DIAGNOSIS — Z885 Allergy status to narcotic agent status: Principal | ICD-10-CM

## 2018-09-07 DIAGNOSIS — M25521 Pain in right elbow: Principal | ICD-10-CM

## 2018-09-07 DIAGNOSIS — W19XXXA Unspecified fall, initial encounter: Principal | ICD-10-CM

## 2018-09-07 MED ORDER — CEPHALEXIN 500 MG CAPSULE
ORAL_CAPSULE | Freq: Four times a day (QID) | ORAL | 0 refills | 0 days | Status: CP
Start: 2018-09-07 — End: 2018-09-17

## 2018-09-07 MED ORDER — OXYCODONE-ACETAMINOPHEN 5 MG-325 MG TABLET
ORAL_TABLET | Freq: Four times a day (QID) | ORAL | 0 refills | 0 days | Status: CP | PRN
Start: 2018-09-07 — End: 2018-09-10

## 2018-09-10 ENCOUNTER — Encounter: Admit: 2018-09-10 | Discharge: 2018-09-11 | Payer: PRIVATE HEALTH INSURANCE

## 2018-09-10 DIAGNOSIS — F419 Anxiety disorder, unspecified: Principal | ICD-10-CM

## 2018-09-10 DIAGNOSIS — S4991XA Unspecified injury of right shoulder and upper arm, initial encounter: Principal | ICD-10-CM

## 2018-09-10 DIAGNOSIS — D649 Anemia, unspecified: Principal | ICD-10-CM

## 2018-09-10 DIAGNOSIS — S5011XA Contusion of right forearm, initial encounter: Principal | ICD-10-CM

## 2018-09-10 DIAGNOSIS — S4990XA Unspecified injury of shoulder and upper arm, unspecified arm, initial encounter: Principal | ICD-10-CM

## 2018-09-10 DIAGNOSIS — M719 Bursopathy, unspecified: Principal | ICD-10-CM

## 2018-09-10 DIAGNOSIS — S51011D Laceration without foreign body of right elbow, subsequent encounter: Principal | ICD-10-CM

## 2018-09-10 DIAGNOSIS — N83209 Unspecified ovarian cyst, unspecified side: Principal | ICD-10-CM

## 2018-09-10 DIAGNOSIS — S8990XA Unspecified injury of unspecified lower leg, initial encounter: Principal | ICD-10-CM

## 2018-09-10 MED ORDER — OXYCODONE-ACETAMINOPHEN 5 MG-325 MG TABLET
ORAL_TABLET | Freq: Four times a day (QID) | ORAL | 0 refills | 0 days | Status: CP | PRN
Start: 2018-09-10 — End: 2018-09-15

## 2018-09-26 ENCOUNTER — Encounter
Admit: 2018-09-26 | Discharge: 2018-09-27 | Payer: PRIVATE HEALTH INSURANCE | Attending: Rehabilitative and Restorative Service Providers" | Primary: Rehabilitative and Restorative Service Providers"

## 2018-09-26 DIAGNOSIS — S51011D Laceration without foreign body of right elbow, subsequent encounter: Principal | ICD-10-CM

## 2018-09-26 DIAGNOSIS — S5011XD Contusion of right forearm, subsequent encounter: Secondary | ICD-10-CM

## 2018-10-01 MED ORDER — FAMOTIDINE 20 MG TABLET
ORAL_TABLET | Freq: Two times a day (BID) | ORAL | 1 refills | 0.00000 days | Status: CP
Start: 2018-10-01 — End: 2018-11-05

## 2018-11-05 ENCOUNTER — Encounter: Admit: 2018-11-05 | Discharge: 2018-11-06 | Payer: PRIVATE HEALTH INSURANCE | Attending: Family | Primary: Family

## 2018-11-05 DIAGNOSIS — R110 Nausea: Secondary | ICD-10-CM

## 2018-11-05 DIAGNOSIS — R1031 Right lower quadrant pain: Principal | ICD-10-CM

## 2018-11-05 MED ORDER — ONDANSETRON 8 MG DISINTEGRATING TABLET
ORAL_TABLET | Freq: Every day | ORAL | 2 refills | 0 days | Status: CP | PRN
Start: 2018-11-05 — End: 2019-02-06

## 2018-11-05 MED ORDER — ACETAMINOPHEN 325 MG TABLET
ORAL_TABLET | ORAL | 2 refills | 0 days | PRN
Start: 2018-11-05 — End: 2019-11-05

## 2018-11-05 MED ORDER — FAMOTIDINE 20 MG TABLET
ORAL_TABLET | Freq: Two times a day (BID) | ORAL | 1 refills | 0 days | Status: CP
Start: 2018-11-05 — End: 2019-02-06

## 2018-11-05 MED ORDER — CYCLOBENZAPRINE 5 MG TABLET
ORAL_TABLET | Freq: Two times a day (BID) | ORAL | 1 refills | 0.00000 days | Status: CP | PRN
Start: 2018-11-05 — End: 2018-11-19

## 2018-11-13 ENCOUNTER — Encounter: Admit: 2018-11-13 | Discharge: 2018-11-14 | Payer: PRIVATE HEALTH INSURANCE

## 2018-11-13 DIAGNOSIS — Z975 Presence of (intrauterine) contraceptive device: Secondary | ICD-10-CM

## 2018-11-13 DIAGNOSIS — R8789 Other abnormal findings in specimens from female genital organs: Secondary | ICD-10-CM

## 2018-11-13 DIAGNOSIS — K210 Gastro-esophageal reflux disease with esophagitis: Secondary | ICD-10-CM

## 2018-11-13 DIAGNOSIS — M549 Dorsalgia, unspecified: Secondary | ICD-10-CM

## 2018-11-13 DIAGNOSIS — R109 Unspecified abdominal pain: Secondary | ICD-10-CM

## 2018-11-13 DIAGNOSIS — G8929 Other chronic pain: Secondary | ICD-10-CM

## 2018-11-13 DIAGNOSIS — Z9081 Acquired absence of spleen: Secondary | ICD-10-CM

## 2018-11-13 DIAGNOSIS — R87619 Unspecified abnormal cytological findings in specimens from cervix uteri: Principal | ICD-10-CM

## 2018-11-16 MED ORDER — GABAPENTIN 300 MG CAPSULE
ORAL_CAPSULE | Freq: Every evening | ORAL | 1 refills | 0.00000 days | Status: CP
Start: 2018-11-16 — End: 2019-11-16

## 2018-11-19 ENCOUNTER — Encounter
Admit: 2018-11-19 | Discharge: 2018-11-20 | Payer: PRIVATE HEALTH INSURANCE | Attending: Obstetrics & Gynecology | Primary: Obstetrics & Gynecology

## 2018-11-19 DIAGNOSIS — Z975 Presence of (intrauterine) contraceptive device: Secondary | ICD-10-CM

## 2018-11-19 DIAGNOSIS — R87619 Unspecified abnormal cytological findings in specimens from cervix uteri: Principal | ICD-10-CM

## 2018-11-19 MED ORDER — DICLOFENAC SODIUM 75 MG TABLET,DELAYED RELEASE
ORAL_TABLET | Freq: Two times a day (BID) | ORAL | 1 refills | 0.00000 days | Status: CP
Start: 2018-11-19 — End: 2019-02-06

## 2018-11-19 MED ORDER — CYCLOBENZAPRINE 5 MG TABLET
ORAL_TABLET | Freq: Two times a day (BID) | ORAL | 1 refills | 0 days | Status: CP | PRN
Start: 2018-11-19 — End: 2019-02-06

## 2018-12-03 MED ORDER — LORAZEPAM 1 MG TABLET
ORAL_TABLET | Freq: Two times a day (BID) | ORAL | 0 refills | 0.00000 days | Status: CP | PRN
Start: 2018-12-03 — End: 2019-02-06

## 2018-12-03 MED ORDER — MISOPROSTOL 200 MCG TABLET
ORAL_TABLET | 0 refills | 0.00000 days | Status: CP
Start: 2018-12-03 — End: 2019-02-06

## 2018-12-13 ENCOUNTER — Encounter
Admit: 2018-12-13 | Discharge: 2018-12-14 | Payer: PRIVATE HEALTH INSURANCE | Attending: Obstetrics & Gynecology | Primary: Obstetrics & Gynecology

## 2018-12-13 DIAGNOSIS — R87619 Unspecified abnormal cytological findings in specimens from cervix uteri: Principal | ICD-10-CM

## 2018-12-13 DIAGNOSIS — Z975 Presence of (intrauterine) contraceptive device: Secondary | ICD-10-CM

## 2019-02-06 ENCOUNTER — Encounter
Admit: 2019-02-06 | Discharge: 2019-02-07 | Payer: PRIVATE HEALTH INSURANCE | Attending: Student in an Organized Health Care Education/Training Program | Primary: Student in an Organized Health Care Education/Training Program

## 2019-02-06 DIAGNOSIS — T8332XD Displacement of intrauterine contraceptive device, subsequent encounter: Principal | ICD-10-CM

## 2019-02-11 MED ORDER — GABAPENTIN 100 MG CAPSULE
ORAL_CAPSULE | Freq: Two times a day (BID) | ORAL | 1 refills | 90.00000 days | Status: CP
Start: 2019-02-11 — End: 2020-02-11

## 2019-02-26 ENCOUNTER — Encounter: Admit: 2019-02-26 | Discharge: 2019-02-27 | Payer: PRIVATE HEALTH INSURANCE | Attending: Family | Primary: Family

## 2019-02-26 DIAGNOSIS — Z01812 Encounter for preprocedural laboratory examination: Secondary | ICD-10-CM

## 2019-02-26 DIAGNOSIS — Z20828 Contact with and (suspected) exposure to other viral communicable diseases: Secondary | ICD-10-CM

## 2019-02-26 DIAGNOSIS — Z1159 Encounter for screening for other viral diseases: Secondary | ICD-10-CM

## 2019-02-27 DIAGNOSIS — Z30433 Encounter for removal and reinsertion of intrauterine contraceptive device: Secondary | ICD-10-CM

## 2019-02-27 DIAGNOSIS — F419 Anxiety disorder, unspecified: Secondary | ICD-10-CM

## 2019-02-27 DIAGNOSIS — Z87891 Personal history of nicotine dependence: Secondary | ICD-10-CM

## 2019-02-28 ENCOUNTER — Encounter: Admit: 2019-02-28 | Discharge: 2019-02-28 | Payer: PRIVATE HEALTH INSURANCE

## 2019-02-28 ENCOUNTER — Ambulatory Visit: Admit: 2019-02-28 | Discharge: 2019-02-28 | Payer: PRIVATE HEALTH INSURANCE

## 2019-02-28 DIAGNOSIS — F419 Anxiety disorder, unspecified: Secondary | ICD-10-CM

## 2019-02-28 DIAGNOSIS — Z87891 Personal history of nicotine dependence: Secondary | ICD-10-CM

## 2019-02-28 DIAGNOSIS — Z30433 Encounter for removal and reinsertion of intrauterine contraceptive device: Secondary | ICD-10-CM

## 2019-02-28 MED ORDER — IBUPROFEN 600 MG TABLET
ORAL_TABLET | Freq: Four times a day (QID) | ORAL | 0 refills | 3 days | Status: CP
Start: 2019-02-28 — End: 2019-03-20

## 2019-02-28 MED ORDER — ACETAMINOPHEN 325 MG TABLET
ORAL_TABLET | Freq: Four times a day (QID) | ORAL | 0 refills | 3 days | Status: CP
Start: 2019-02-28 — End: 2019-03-20

## 2019-03-20 ENCOUNTER — Ambulatory Visit: Admit: 2019-03-20 | Discharge: 2019-03-21 | Payer: PRIVATE HEALTH INSURANCE

## 2019-03-20 DIAGNOSIS — D7389 Other diseases of spleen: Secondary | ICD-10-CM | POA: Insufficient documentation

## 2019-03-20 DIAGNOSIS — M549 Dorsalgia, unspecified: Secondary | ICD-10-CM

## 2019-03-20 DIAGNOSIS — R110 Nausea: Secondary | ICD-10-CM

## 2019-03-20 DIAGNOSIS — M21619 Bunion of unspecified foot: Secondary | ICD-10-CM

## 2019-03-20 DIAGNOSIS — R109 Unspecified abdominal pain: Secondary | ICD-10-CM

## 2019-03-20 MED ORDER — ONDANSETRON HCL 4 MG TABLET
ORAL_TABLET | Freq: Two times a day (BID) | ORAL | 0 refills | 15 days | Status: CP | PRN
Start: 2019-03-20 — End: 2019-03-27

## 2019-03-28 ENCOUNTER — Encounter
Admit: 2019-03-28 | Discharge: 2019-03-29 | Payer: PRIVATE HEALTH INSURANCE | Attending: Podiatrist | Primary: Podiatrist

## 2019-03-28 DIAGNOSIS — Z01818 Encounter for other preprocedural examination: Secondary | ICD-10-CM

## 2019-03-28 DIAGNOSIS — M2011 Hallux valgus (acquired), right foot: Secondary | ICD-10-CM

## 2019-03-28 DIAGNOSIS — M21619 Bunion of unspecified foot: Secondary | ICD-10-CM

## 2019-03-29 ENCOUNTER — Encounter: Admit: 2019-03-29 | Discharge: 2019-03-30 | Payer: PRIVATE HEALTH INSURANCE

## 2019-03-29 DIAGNOSIS — M549 Dorsalgia, unspecified: Secondary | ICD-10-CM

## 2019-03-29 DIAGNOSIS — M2011 Hallux valgus (acquired), right foot: Secondary | ICD-10-CM

## 2019-03-29 DIAGNOSIS — M5137 Other intervertebral disc degeneration, lumbosacral region: Secondary | ICD-10-CM

## 2019-04-02 DIAGNOSIS — M2011 Hallux valgus (acquired), right foot: Principal | ICD-10-CM

## 2019-05-02 DIAGNOSIS — M2010 Hallux valgus (acquired), unspecified foot: Principal | ICD-10-CM

## 2019-05-06 ENCOUNTER — Ambulatory Visit: Admit: 2019-05-06 | Discharge: 2019-05-07 | Payer: PRIVATE HEALTH INSURANCE

## 2019-05-09 ENCOUNTER — Encounter: Admit: 2019-05-09 | Discharge: 2019-05-09 | Payer: PRIVATE HEALTH INSURANCE

## 2019-05-09 ENCOUNTER — Encounter
Admit: 2019-05-09 | Discharge: 2019-05-09 | Payer: PRIVATE HEALTH INSURANCE | Attending: Podiatrist | Primary: Podiatrist

## 2019-05-09 DIAGNOSIS — M2011 Hallux valgus (acquired), right foot: Principal | ICD-10-CM

## 2019-05-20 ENCOUNTER — Encounter: Admit: 2019-05-20 | Discharge: 2019-05-20 | Payer: PRIVATE HEALTH INSURANCE

## 2019-05-20 ENCOUNTER — Encounter: Admit: 2019-05-20 | Discharge: 2019-05-20 | Payer: PRIVATE HEALTH INSURANCE | Attending: Family | Primary: Family

## 2019-05-22 ENCOUNTER — Encounter: Admit: 2019-05-22 | Discharge: 2019-05-22 | Payer: PRIVATE HEALTH INSURANCE

## 2019-05-22 ENCOUNTER — Encounter
Admit: 2019-05-22 | Discharge: 2019-05-22 | Payer: PRIVATE HEALTH INSURANCE | Attending: Anesthesiology | Primary: Anesthesiology

## 2019-05-22 MED ORDER — OXYCODONE 5 MG TABLET
ORAL_TABLET | ORAL | 0 refills | 0 days | Status: CP | PRN
Start: 2019-05-22 — End: ?
  Filled 2019-05-22: qty 15, 2d supply, fill #0

## 2019-05-22 MED ORDER — ASPIRIN 325 MG TABLET,DELAYED RELEASE
ORAL_TABLET | Freq: Every day | ORAL | 11 refills | 0.00000 days
Start: 2019-05-22 — End: 2020-05-21

## 2019-05-22 MED ORDER — PROMETHAZINE 12.5 MG TABLET
ORAL_TABLET | Freq: Four times a day (QID) | ORAL | 0 refills | 0.00000 days | Status: CP | PRN
Start: 2019-05-22 — End: ?
  Filled 2019-05-22: qty 15, 3d supply, fill #0

## 2019-05-22 MED FILL — PROMETHAZINE 12.5 MG TABLET: 3 days supply | Qty: 15 | Fill #0 | Status: AC

## 2019-05-22 MED FILL — OXYCODONE 5 MG TABLET: 2 days supply | Qty: 15 | Fill #0 | Status: AC

## 2019-05-24 DIAGNOSIS — M2011 Hallux valgus (acquired), right foot: Principal | ICD-10-CM

## 2019-05-24 MED ORDER — OXYCODONE 5 MG TABLET
ORAL_TABLET | ORAL | 0 refills | 3 days | Status: CP | PRN
Start: 2019-05-24 — End: ?

## 2019-05-27 ENCOUNTER — Encounter: Admit: 2019-05-27 | Discharge: 2019-05-28 | Payer: PRIVATE HEALTH INSURANCE

## 2019-05-27 ENCOUNTER — Ambulatory Visit
Admit: 2019-05-27 | Discharge: 2019-05-28 | Payer: PRIVATE HEALTH INSURANCE | Attending: Podiatrist | Primary: Podiatrist

## 2019-05-27 DIAGNOSIS — Z9889 Other specified postprocedural states: Principal | ICD-10-CM

## 2019-05-27 MED ORDER — OXYCODONE 5 MG TABLET
ORAL_TABLET | Freq: Four times a day (QID) | ORAL | 0 refills | 4 days | Status: CP | PRN
Start: 2019-05-27 — End: ?

## 2019-06-03 ENCOUNTER — Ambulatory Visit
Admit: 2019-06-03 | Discharge: 2019-06-04 | Payer: PRIVATE HEALTH INSURANCE | Attending: Podiatrist | Primary: Podiatrist

## 2019-06-03 DIAGNOSIS — Z9889 Other specified postprocedural states: Principal | ICD-10-CM

## 2019-06-06 MED ORDER — SERTRALINE 100 MG TABLET
ORAL_TABLET | Freq: Every day | ORAL | 1 refills | 90 days | Status: CP
Start: 2019-06-06 — End: 2020-06-05

## 2019-06-06 MED ORDER — GABAPENTIN 300 MG CAPSULE
ORAL_CAPSULE | Freq: Every evening | ORAL | 1 refills | 90.00000 days | Status: CP
Start: 2019-06-06 — End: 2020-06-05

## 2019-06-17 ENCOUNTER — Encounter: Admit: 2019-06-17 | Discharge: 2019-06-18 | Payer: PRIVATE HEALTH INSURANCE

## 2019-06-17 ENCOUNTER — Ambulatory Visit
Admit: 2019-06-17 | Discharge: 2019-06-18 | Payer: PRIVATE HEALTH INSURANCE | Attending: Podiatrist | Primary: Podiatrist

## 2019-06-17 DIAGNOSIS — Z9889 Other specified postprocedural states: Principal | ICD-10-CM

## 2019-08-05 MED ORDER — GABAPENTIN 300 MG CAPSULE
ORAL_CAPSULE | Freq: Every evening | ORAL | 0 refills | 90 days | Status: CP
Start: 2019-08-05 — End: 2020-08-04

## 2019-08-09 MED ORDER — GABAPENTIN 100 MG CAPSULE
ORAL_CAPSULE | Freq: Two times a day (BID) | ORAL | 1 refills | 90 days | Status: CP
Start: 2019-08-09 — End: 2020-08-08

## 2019-08-26 ENCOUNTER — Encounter: Admit: 2019-08-26 | Discharge: 2019-08-27 | Payer: PRIVATE HEALTH INSURANCE

## 2019-08-26 DIAGNOSIS — R7989 Other specified abnormal findings of blood chemistry: Principal | ICD-10-CM

## 2019-08-26 DIAGNOSIS — F419 Anxiety disorder, unspecified: Principal | ICD-10-CM

## 2019-08-26 DIAGNOSIS — Z9081 Acquired absence of spleen: Principal | ICD-10-CM

## 2019-08-26 DIAGNOSIS — Z1159 Encounter for screening for other viral diseases: Principal | ICD-10-CM

## 2019-08-26 MED ORDER — CLONAZEPAM 0.5 MG TABLET
ORAL_TABLET | Freq: Every evening | ORAL | 0 refills | 20 days | Status: CP | PRN
Start: 2019-08-26 — End: ?

## 2019-08-26 MED ORDER — SERTRALINE 100 MG TABLET
ORAL_TABLET | Freq: Every day | ORAL | 3 refills | 90 days | Status: CP
Start: 2019-08-26 — End: 2020-08-25

## 2019-08-26 MED ORDER — GABAPENTIN 300 MG CAPSULE
ORAL_CAPSULE | Freq: Every evening | ORAL | 1 refills | 90.00000 days | Status: CP
Start: 2019-08-26 — End: 2020-08-25

## 2019-08-29 ENCOUNTER — Other Ambulatory Visit: Admit: 2019-08-29 | Discharge: 2019-08-30 | Payer: PRIVATE HEALTH INSURANCE

## 2019-08-29 DIAGNOSIS — R7989 Other specified abnormal findings of blood chemistry: Principal | ICD-10-CM

## 2019-08-29 DIAGNOSIS — Z1159 Encounter for screening for other viral diseases: Principal | ICD-10-CM

## 2019-10-24 MED ORDER — CLONAZEPAM 0.5 MG TABLET
ORAL_TABLET | Freq: Every evening | ORAL | 0 refills | 20 days | Status: CP | PRN
Start: 2019-10-24 — End: ?

## 2019-11-03 DIAGNOSIS — R110 Nausea: Principal | ICD-10-CM

## 2019-11-06 MED ORDER — ONDANSETRON HCL 4 MG TABLET
ORAL_TABLET | Freq: Two times a day (BID) | ORAL | 0 refills | 15 days | Status: CP | PRN
Start: 2019-11-06 — End: 2020-11-05

## 2019-11-11 ENCOUNTER — Telehealth: Payer: Self-pay | Admitting: Family Medicine

## 2019-11-11 NOTE — Progress Notes (Signed)
  Chronic Care Management   Outreach Note  11/11/2019 Name: Julie Leach MRN: [id number] DOB: 01-10-1981  Referred by: Cyndie Mull, DO Reason for referral : No chief complaint on file.   An unsuccessful telephone outreach was attempted today. The patient was referred to the pharmacist for assistance with care management and care coordination.   This note is not being shared with the patient for the following reason: To respect privacy (The patient or proxy has requested that the information not be shared).  Follow Up Plan:   Lynnae January Upstream Scheduler

## 2020-03-17 DIAGNOSIS — R110 Nausea: Principal | ICD-10-CM

## 2020-03-17 DIAGNOSIS — R1031 Right lower quadrant pain: Principal | ICD-10-CM

## 2020-03-17 MED ORDER — CLONAZEPAM 0.5 MG TABLET
ORAL_TABLET | Freq: Every evening | ORAL | 0 refills | 20 days | Status: CP | PRN
Start: 2020-03-17 — End: ?

## 2020-03-17 MED ORDER — ONDANSETRON HCL 4 MG TABLET
ORAL_TABLET | Freq: Two times a day (BID) | ORAL | 0 refills | 15 days | Status: CP | PRN
Start: 2020-03-17 — End: 2021-03-17

## 2020-03-25 ENCOUNTER — Encounter
Admit: 2020-03-25 | Discharge: 2020-03-25 | Discharge status: Against Medical Advice | Payer: PRIVATE HEALTH INSURANCE | Attending: Student in an Organized Health Care Education/Training Program

## 2020-03-25 ENCOUNTER — Encounter: Payer: Self-pay | Admitting: Emergency Medicine

## 2020-03-25 ENCOUNTER — Other Ambulatory Visit: Payer: Self-pay

## 2020-03-25 ENCOUNTER — Ambulatory Visit
Admission: EM | Admit: 2020-03-25 | Discharge: 2020-03-25 | Disposition: A | Discharge status: Home / Self Care | Payer: BLUE CROSS/BLUE SHIELD | Attending: Physician Assistant | Admitting: Physician Assistant

## 2020-03-25 DIAGNOSIS — R202 Paresthesia of skin: Secondary | ICD-10-CM

## 2020-03-25 DIAGNOSIS — R0789 Other chest pain: Secondary | ICD-10-CM | POA: Diagnosis not present

## 2020-03-25 NOTE — ED Provider Notes (Signed)
[facility]    CSN: [id number] Arrival date & time: 03/25/20  1239      History   Chief Complaint Chief Complaint  Patient presents with  . Chest Pain    HPI Julie SWAGGERTY is a 39 y.o. female presenting for sudden onset of left-sided chest pain with radiation to the left shoulder last night.  She said the pain started out as mild last night but has worsened throughout the course of the day and especially in the last hour.  Says that over the past 30 minutes to hours she has developed numbness of the left hand.  Chest pain is worse with taking a deep breath.  She denies feeling short of breath.  She admits to associated dizziness and nausea without vomiting.  She has no chest tenderness.  She cannot think of anything that makes the pain better or worse.  She says sometimes it feels like the pain radiates to her back as well.  Denies any abdominal pain.  Denies any fever, cough, congestion.  Denies any recent illness.  No known Covid exposure.  Patient has taken aspirin without relief.  She says she has never had anything like this before.  She does admit to a history of anxiety, but says she has never had a panic attack that felt similar to this.  Denies any history of PE, DVT, or blood clotting disorders.  Patient not taking any oral contraceptives.  Denies being immunocompromised.  No leg pain or swelling.  Pain has no association to food or eating.  Patient has no other symptoms.  No other concerns today.     Past Medical History:  Diagnosis Date  . Anxiety   . Chronic back pain     There are no problems to display for this patient.   Past Surgical History:  Procedure Laterality Date  . CESAREAN SECTION    . SPLENECTOMY, TOTAL      OB History   No obstetric history on file.      Home Medications    Prior to Admission medications   Medication Sig Start Date End Date Taking? Authorizing Provider  gabapentin (NEURONTIN) 300 MG capsule Take 300 mg by mouth  3 (three) times daily.   Yes [provider]  levonorgestrel (MIRENA) 20 MCG/24HR IUD 1 each by Intrauterine route once.   Yes [provider]  sertraline (ZOLOFT) 100 MG tablet Take 100 mg by mouth daily.   Yes [provider]  ondansetron (ZOFRAN ODT) 8 MG disintegrating tablet Take 1 tablet (8 mg total) by mouth every 8 (eight) hours as needed for nausea or vomiting. 03/03/15   Hassan Rowan, MD    Family History Family History  Problem Relation Age of Onset  . Cancer Mother 76       unknown primary bones,lung,and liver    Social History Social History   Tobacco Use  . Smoking status: Former Smoker    Quit date: 02/20/2008    Years since quitting: 12.1  . Smokeless tobacco: Never Used  Vaping Use  . Vaping Use: Never used  Substance Use Topics  . Alcohol use: Yes  . Drug use: Never     Allergies   Mobic [meloxicam], Morphine and related, and Latex   Review of Systems Review of Systems  Constitutional: Negative for chills, diaphoresis, fatigue and fever.  HENT: Negative for congestion, ear pain, rhinorrhea, sinus pressure, sinus pain and sore throat.   Respiratory: Negative for cough, shortness of breath  and wheezing.   Cardiovascular: Positive for chest pain. Negative for palpitations and leg swelling.  Gastrointestinal: Positive for nausea. Negative for abdominal pain and vomiting.  Musculoskeletal: Positive for back pain. Negative for arthralgias and myalgias.  Skin: Negative for rash.  Neurological: Positive for dizziness, light-headedness, numbness and headaches. Negative for weakness.  Hematological: Negative for adenopathy.     Physical Exam Triage Vital Signs ED Triage Vitals  Enc Vitals Group     BP 03/25/20 1243 (!) 157/94     Pulse Rate 03/25/20 1243 88     Resp 03/25/20 1243 18     Temp --      Temp src --      SpO2 03/25/20 1243 97 %     Weight 03/25/20 1245 147 lb 14.9 oz (67.1 kg)     Height 03/25/20 1245 5\' 2"  (1.575  m)     Head Circumference --      Peak Flow --      Pain Score 03/25/20 1244 6     Pain Loc --      Pain Edu? --      Excl. in GC? --    No data found.  Updated Vital Signs BP (!) 157/94 (BP Location: Left Arm)   Pulse 88   Resp 18   Ht 5\' 2"  (1.575 m)   Wt 147 lb 14.9 oz (67.1 kg)   SpO2 97%   BMI 27.06 kg/m       Physical Exam Vitals and nursing note reviewed.  Constitutional:      General: She is in acute distress (moderate, anxious, tearful).     Appearance: Normal appearance. She is not ill-appearing or toxic-appearing.  HENT:     Head: Normocephalic and atraumatic.     Nose: Nose normal.     Mouth/Throat:     Mouth: Mucous membranes are moist.     Pharynx: Oropharynx is clear.  Eyes:     General: No scleral icterus.       Right eye: No discharge.        Left eye: No discharge.     Conjunctiva/sclera: Conjunctivae normal.  Cardiovascular:     Rate and Rhythm: Normal rate and regular rhythm.     Heart sounds: Normal heart sounds.  Pulmonary:     Effort: Pulmonary effort is normal. No respiratory distress.     Breath sounds: Normal breath sounds. No wheezing, rhonchi or rales.  Chest:     Chest wall: No tenderness.  Abdominal:     Palpations: Abdomen is soft.     Tenderness: There is no abdominal tenderness.  Musculoskeletal:     Cervical back: Normal range of motion and neck supple.     Right lower leg: No edema.     Left lower leg: No edema.  Skin:    General: Skin is dry.  Neurological:     General: No focal deficit present.     Mental Status: She is alert and oriented to person, place, and time. Mental status is at baseline.     Cranial Nerves: No cranial nerve deficit.     Motor: No weakness.     Gait: Gait normal.  Psychiatric:        Behavior: Behavior normal.        Thought Content: Thought content normal.      UC Treatments / Results  Labs (all labs ordered are listed, but only abnormal results are displayed) Labs Reviewed - No data to  display  EKG   Radiology No results found.  Procedures ED EKG  Date/Time: 03/25/2020 1:29 PM Performed by: Shirlee Latch, PA-C Authorized by: Shirlee Latch, PA-C   ECG reviewed by ED Physician in the absence of a cardiologist: yes   Previous ECG:    Previous ECG:  Unavailable Interpretation:    Interpretation: normal   Rate:    ECG rate:  83   ECG rate assessment: normal   Rhythm:    Rhythm: sinus rhythm   Ectopy:    Ectopy: none   QRS:    QRS axis:  Normal Conduction:    Conduction: normal   ST segments:    ST segments:  Normal T waves:    T waves: normal   Comments:     Normal sinus rhythm and regular rate. Normal EKG   (including critical care time)  Medications Ordered in UC Medications - No data to display  Initial Impression / Assessment and Plan / UC Course  I have reviewed the triage vital signs and the nursing notes.  Pertinent labs & imaging results that were available during my care of the patient were reviewed by me and considered in my medical decision making (see chart for details).   EKG is within normal limits. Blood pressure slightly elevated at 157/94. All other vital signs are normal and stable. Chest is clear to auscultation. Based on patient's presentation advised patient to follow-up in the ED for monitoring and work-up. DDx includes PE, NSTEMI, viral illness, pleurisy, pneumonia, anxiety/panic.  Patient declined EMS transport. Husband will take her [facility] ED immediately. Patient leaves in stable condition.  Final Clinical Impressions(s) / UC Diagnoses   Final diagnoses:  Atypical chest pain  Paresthesia of arm     Discharge Instructions     You have been advised to follow up immediately in the emergency department for concerning signs.symptoms. If you declined EMS transport, please have a family member take you directly to the ED at this time. Do not delay. Based on concerns about condition, if you do not follow up in  th e ED, you may risk poor outcomes including worsening of condition, delayed treatment and potentially life threatening issues. If you have declined to go to the ED at this time, you should call your PCP immediately to set up a follow up appointment.  Go to ED for red flag symptoms, including; fevers you cannot reduce with Tylenol/Motrin, severe headaches, vision changes, numbness/weakness in part of the body, lethargy, confusion, intractable vomiting, severe dehydration, chest pain, breathing difficulty, severe persistent abdominal or pelvic pain, signs of severe infection (increased redness, swelling of an area), feeling faint or passing out, dizziness, etc. You should especially go to the ED for sudden acute worsening of condition if you do not elect to go at this time.     ED Prescriptions    None     PDMP not reviewed this encounter.   Shirlee Latch, PA-C 03/25/20 1334

## 2020-03-25 NOTE — Discharge Instructions (Signed)

## 2020-03-25 NOTE — ED Triage Notes (Signed)
Pt c/o chest pain, shortness of breath, nausea, and tingling in her left arm. Started yesterday.

## 2020-03-25 NOTE — ED Notes (Signed)
Patient is being discharged from the Urgent Care and sent to the Emergency Department via private vehicle . Per Clotilde Dieter, PA, patient is in need of higher level of care due to chest pain. Patient is aware and verbalizes understanding of plan of care.  Vitals:   03/25/20 1243  BP: (!) 157/94  Pulse: 88  Resp: 18  SpO2: 97%

## 2020-06-17 MED ORDER — GABAPENTIN 300 MG CAPSULE
ORAL_CAPSULE | Freq: Every evening | ORAL | 0 refills | 90.00000 days | Status: CP
Start: 2020-06-17 — End: 2020-08-06

## 2020-06-17 MED ORDER — CLONAZEPAM 0.5 MG TABLET
ORAL_TABLET | Freq: Every evening | ORAL | 0 refills | 20.00000 days | Status: CP | PRN
Start: 2020-06-17 — End: 2021-06-17

## 2020-08-06 ENCOUNTER — Ambulatory Visit: Admit: 2020-08-06 | Discharge: 2020-08-07 | Payer: PRIVATE HEALTH INSURANCE

## 2020-08-06 DIAGNOSIS — F321 Major depressive disorder, single episode, moderate: Principal | ICD-10-CM

## 2020-08-06 DIAGNOSIS — R140 Abdominal distension (gaseous): Principal | ICD-10-CM

## 2020-08-06 DIAGNOSIS — Z0000 Encounter for general adult medical examination without abnormal findings: Principal | ICD-10-CM

## 2020-08-06 DIAGNOSIS — N6452 Nipple discharge: Principal | ICD-10-CM

## 2020-08-06 DIAGNOSIS — R87612 Low grade squamous intraepithelial lesion on cytologic smear of cervix (LGSIL): Principal | ICD-10-CM

## 2020-08-06 MED ORDER — GABAPENTIN 300 MG CAPSULE
ORAL_CAPSULE | Freq: Every evening | ORAL | 3 refills | 90 days | Status: CP
Start: 2020-08-06 — End: 2021-08-06

## 2020-08-06 MED ORDER — SERTRALINE 100 MG TABLET
ORAL_TABLET | Freq: Every day | ORAL | 3 refills | 90 days | Status: CP
Start: 2020-08-06 — End: 2021-08-06

## 2020-08-06 MED ORDER — HYDROXYZINE HCL 25 MG TABLET
ORAL_TABLET | Freq: Three times a day (TID) | ORAL | 0 refills | 10 days | Status: CP | PRN
Start: 2020-08-06 — End: ?

## 2020-09-07 ENCOUNTER — Ambulatory Visit: Admit: 2020-09-07 | Discharge: 2020-09-07 | Payer: PRIVATE HEALTH INSURANCE

## 2020-09-07 ENCOUNTER — Encounter: Admit: 2020-09-07 | Discharge: 2020-09-07 | Payer: PRIVATE HEALTH INSURANCE | Attending: Clinical | Primary: Clinical

## 2020-09-21 ENCOUNTER — Encounter: Admit: 2020-09-21 | Discharge: 2020-09-22 | Payer: PRIVATE HEALTH INSURANCE

## 2020-09-21 DIAGNOSIS — L309 Dermatitis, unspecified: Principal | ICD-10-CM

## 2020-09-21 DIAGNOSIS — L811 Chloasma: Principal | ICD-10-CM

## 2020-09-21 DIAGNOSIS — F419 Anxiety disorder, unspecified: Principal | ICD-10-CM

## 2020-09-21 MED ORDER — TRIAMCINOLONE ACETONIDE 0.1 % TOPICAL CREAM
Freq: Two times a day (BID) | TOPICAL | 1 refills | 0 days | Status: CP
Start: 2020-09-21 — End: 2021-09-21

## 2020-10-05 MED ORDER — HYDROXYZINE HCL 25 MG TABLET
ORAL_TABLET | Freq: Three times a day (TID) | ORAL | 1 refills | 30 days | Status: CP | PRN
Start: 2020-10-05 — End: 2021-10-05

## 2020-12-04 ENCOUNTER — Other Ambulatory Visit: Payer: Self-pay | Admitting: *Deleted

## 2020-12-16 MED ORDER — HYDROXYZINE HCL 25 MG TABLET
ORAL_TABLET | Freq: Three times a day (TID) | ORAL | 1 refills | 30 days | Status: CP | PRN
Start: 2020-12-16 — End: 2021-12-16

## 2021-01-05 ENCOUNTER — Ambulatory Visit: Admit: 2021-01-05 | Payer: PRIVATE HEALTH INSURANCE

## 2021-01-20 ENCOUNTER — Encounter: Admit: 2021-01-20 | Discharge: 2021-01-21 | Payer: PRIVATE HEALTH INSURANCE

## 2021-01-20 DIAGNOSIS — L811 Chloasma: Secondary | ICD-10-CM | POA: Insufficient documentation

## 2021-01-20 DIAGNOSIS — F419 Anxiety disorder, unspecified: Principal | ICD-10-CM

## 2021-01-20 DIAGNOSIS — Z7289 Other problems related to lifestyle: Principal | ICD-10-CM

## 2021-01-20 MED ORDER — AZELAIC ACID 15 % TOPICAL GEL
Freq: Two times a day (BID) | TOPICAL | 1 refills | 0 days | Status: CP
Start: 2021-01-20 — End: 2022-01-20

## 2021-01-25 DIAGNOSIS — F331 Major depressive disorder, recurrent, moderate: Principal | ICD-10-CM

## 2021-01-25 DIAGNOSIS — F419 Anxiety disorder, unspecified: Principal | ICD-10-CM

## 2021-01-25 DIAGNOSIS — F32A Depression, unspecified depression type: Principal | ICD-10-CM

## 2021-01-25 MED ORDER — BUPROPION HCL XL 150 MG 24 HR TABLET, EXTENDED RELEASE
ORAL_TABLET | Freq: Every morning | ORAL | 1 refills | 90 days | Status: CP
Start: 2021-01-25 — End: 2022-01-25

## 2021-03-03 ENCOUNTER — Ambulatory Visit: Admit: 2021-03-03 | Discharge: 2021-03-04 | Payer: PRIVATE HEALTH INSURANCE

## 2021-03-03 DIAGNOSIS — F4321 Adjustment disorder with depressed mood: Principal | ICD-10-CM

## 2021-03-03 DIAGNOSIS — F419 Anxiety disorder, unspecified: Principal | ICD-10-CM

## 2021-03-03 DIAGNOSIS — F32A Depression, unspecified depression type: Principal | ICD-10-CM

## 2021-03-03 DIAGNOSIS — Z7289 Other problems related to lifestyle: Principal | ICD-10-CM

## 2021-05-19 ENCOUNTER — Ambulatory Visit: Admit: 2021-05-19 | Discharge: 2021-05-20 | Payer: PRIVATE HEALTH INSURANCE

## 2021-05-19 DIAGNOSIS — F419 Anxiety disorder, unspecified: Principal | ICD-10-CM

## 2021-05-19 DIAGNOSIS — B079 Viral wart, unspecified: Principal | ICD-10-CM

## 2021-05-19 DIAGNOSIS — L739 Follicular disorder, unspecified: Principal | ICD-10-CM

## 2021-05-19 MED ORDER — CLINDAMYCIN 1 % LOTION
Freq: Two times a day (BID) | TOPICAL | 6 refills | 0 days | Status: CP
Start: 2021-05-19 — End: 2022-05-19

## 2021-05-19 MED ORDER — SULFAMETHOXAZOLE 800 MG-TRIMETHOPRIM 160 MG TABLET
ORAL_TABLET | Freq: Two times a day (BID) | ORAL | 0 refills | 7 days | Status: CP
Start: 2021-05-19 — End: 2021-05-26

## 2021-06-07 DIAGNOSIS — L739 Follicular disorder, unspecified: Principal | ICD-10-CM

## 2021-07-14 ENCOUNTER — Ambulatory Visit: Admit: 2021-07-14 | Discharge: 2021-07-15 | Payer: PRIVATE HEALTH INSURANCE

## 2021-07-14 DIAGNOSIS — J028 Acute pharyngitis due to other specified organisms: Principal | ICD-10-CM

## 2021-07-14 DIAGNOSIS — N6019 Diffuse cystic mastopathy of unspecified breast: Principal | ICD-10-CM

## 2021-07-14 DIAGNOSIS — B9789 Other viral agents as the cause of diseases classified elsewhere: Principal | ICD-10-CM

## 2021-07-14 DIAGNOSIS — R110 Nausea: Principal | ICD-10-CM

## 2021-07-14 DIAGNOSIS — F419 Anxiety disorder, unspecified: Principal | ICD-10-CM

## 2021-07-14 MED ORDER — ONDANSETRON HCL 4 MG TABLET
ORAL_TABLET | Freq: Three times a day (TID) | ORAL | 0 refills | 10 days | Status: CP | PRN
Start: 2021-07-14 — End: 2021-07-21

## 2021-09-15 ENCOUNTER — Ambulatory Visit: Admit: 2021-09-15 | Discharge: 2021-09-16 | Payer: PRIVATE HEALTH INSURANCE

## 2021-09-15 DIAGNOSIS — L309 Dermatitis, unspecified: Principal | ICD-10-CM

## 2021-09-15 DIAGNOSIS — L739 Follicular disorder, unspecified: Principal | ICD-10-CM

## 2021-09-15 MED ORDER — CLINDAMYCIN HCL 300 MG CAPSULE
ORAL_CAPSULE | Freq: Two times a day (BID) | ORAL | 0 refills | 10 days | Status: CP
Start: 2021-09-15 — End: 2021-09-25

## 2021-09-15 MED ORDER — CLINDAMYCIN 1 % LOTION
Freq: Two times a day (BID) | TOPICAL | 6 refills | 0.00000 days | Status: CP
Start: 2021-09-15 — End: 2022-09-15

## 2021-09-15 MED ORDER — TRIAMCINOLONE ACETONIDE 0.1 % TOPICAL CREAM
Freq: Two times a day (BID) | TOPICAL | 3 refills | 0.00000 days | Status: CP
Start: 2021-09-15 — End: 2022-09-15

## 2021-11-16 MED ORDER — GABAPENTIN 300 MG CAPSULE
ORAL_CAPSULE | Freq: Every evening | ORAL | 3 refills | 90 days | Status: CP
Start: 2021-11-16 — End: 2022-11-16

## 2022-01-28 ENCOUNTER — Ambulatory Visit: Admit: 2022-01-28 | Discharge: 2022-01-29 | Payer: PRIVATE HEALTH INSURANCE

## 2022-01-28 DIAGNOSIS — F339 Major depressive disorder, recurrent, unspecified: Secondary | ICD-10-CM | POA: Insufficient documentation

## 2022-01-28 DIAGNOSIS — Z9081 Acquired absence of spleen: Secondary | ICD-10-CM | POA: Insufficient documentation

## 2022-01-28 DIAGNOSIS — F411 Generalized anxiety disorder: Secondary | ICD-10-CM | POA: Insufficient documentation

## 2022-01-28 DIAGNOSIS — Z1231 Encounter for screening mammogram for malignant neoplasm of breast: Principal | ICD-10-CM

## 2022-01-28 DIAGNOSIS — M549 Dorsalgia, unspecified: Principal | ICD-10-CM

## 2022-01-28 DIAGNOSIS — F3342 Major depressive disorder, recurrent, in full remission: Principal | ICD-10-CM

## 2022-01-28 DIAGNOSIS — L97919 Non-pressure chronic ulcer of unspecified part of right lower leg with unspecified severity: Principal | ICD-10-CM

## 2022-01-28 DIAGNOSIS — G8929 Other chronic pain: Principal | ICD-10-CM

## 2022-01-28 DIAGNOSIS — L97929 Non-pressure chronic ulcer of unspecified part of left lower leg with unspecified severity: Principal | ICD-10-CM

## 2022-01-28 DIAGNOSIS — L989 Disorder of the skin and subcutaneous tissue, unspecified: Principal | ICD-10-CM

## 2022-01-28 MED ORDER — SERTRALINE 100 MG TABLET
ORAL_TABLET | Freq: Every day | ORAL | 3 refills | 90 days | Status: CP
Start: 2022-01-28 — End: ?

## 2022-01-31 ENCOUNTER — Ambulatory Visit: Admit: 2022-01-31 | Discharge: 2022-02-01 | Payer: PRIVATE HEALTH INSURANCE

## 2022-01-31 DIAGNOSIS — L97919 Non-pressure chronic ulcer of unspecified part of right lower leg with unspecified severity: Principal | ICD-10-CM

## 2022-01-31 DIAGNOSIS — L989 Disorder of the skin and subcutaneous tissue, unspecified: Principal | ICD-10-CM

## 2022-01-31 DIAGNOSIS — L97929 Non-pressure chronic ulcer of unspecified part of left lower leg with unspecified severity: Principal | ICD-10-CM

## 2022-01-31 DIAGNOSIS — R21 Rash and other nonspecific skin eruption: Principal | ICD-10-CM

## 2022-01-31 MED ORDER — VALACYCLOVIR 1 GRAM TABLET
ORAL_TABLET | Freq: Two times a day (BID) | ORAL | 0 refills | 7 days | Status: CP
Start: 2022-01-31 — End: 2022-02-07

## 2022-01-31 MED ORDER — PREDNISONE 10 MG TABLET
ORAL_TABLET | 0 refills | 0 days | Status: CP
Start: 2022-01-31 — End: ?

## 2022-02-02 DIAGNOSIS — L089 Local infection of the skin and subcutaneous tissue, unspecified: Principal | ICD-10-CM

## 2022-02-02 DIAGNOSIS — R21 Rash and other nonspecific skin eruption: Principal | ICD-10-CM

## 2022-02-02 MED ORDER — DOXYCYCLINE HYCLATE 100 MG TABLET
ORAL_TABLET | Freq: Two times a day (BID) | ORAL | 0 refills | 14.00000 days | Status: CP
Start: 2022-02-02 — End: 2022-02-02

## 2022-02-05 DIAGNOSIS — L309 Dermatitis, unspecified: Secondary | ICD-10-CM | POA: Insufficient documentation

## 2022-02-14 ENCOUNTER — Ambulatory Visit: Admit: 2022-02-14 | Discharge: 2022-02-15 | Payer: PRIVATE HEALTH INSURANCE

## 2022-02-14 MED ORDER — TRIAMCINOLONE ACETONIDE 0.1 % TOPICAL OINTMENT
INTRAMUSCULAR | 0 refills | 0.00000 days | Status: CP
Start: 2022-02-14 — End: ?

## 2022-02-14 MED ORDER — CLOBETASOL 0.05 % TOPICAL OINTMENT
1 refills | 0 days | Status: CP
Start: 2022-02-14 — End: ?

## 2022-02-14 MED ORDER — MUPIROCIN 2 % TOPICAL OINTMENT
2 refills | 0 days | Status: CP
Start: 2022-02-14 — End: ?

## 2022-02-18 DIAGNOSIS — F1011 Alcohol abuse, in remission: Secondary | ICD-10-CM | POA: Insufficient documentation

## 2022-02-18 DIAGNOSIS — F411 Generalized anxiety disorder: Principal | ICD-10-CM

## 2022-02-18 MED ORDER — ALPRAZOLAM 0.5 MG TABLET
ORAL_TABLET | Freq: Two times a day (BID) | ORAL | 0 refills | 5 days | Status: CP | PRN
Start: 2022-02-18 — End: 2023-02-18

## 2022-04-04 ENCOUNTER — Ambulatory Visit: Admit: 2022-04-04 | Discharge: 2022-04-05 | Payer: PRIVATE HEALTH INSURANCE

## 2022-04-04 DIAGNOSIS — L300 Nummular dermatitis: Principal | ICD-10-CM

## 2022-04-04 MED ORDER — DUPILUMAB 300 MG/2 ML SUBCUTANEOUS SYRINGE
Freq: Once | SUBCUTANEOUS | 0 refills | 1.00000 days | Status: CP
Start: 2022-04-04 — End: 2022-04-04

## 2022-04-04 MED ORDER — PIMECROLIMUS 1 % TOPICAL CREAM
Freq: Two times a day (BID) | TOPICAL | 1 refills | 0.00000 days | Status: CP
Start: 2022-04-04 — End: 2023-04-04

## 2022-04-04 MED ORDER — PREDNISONE 10 MG TABLET
ORAL_TABLET | ORAL | 0 refills | 21.00000 days | Status: CP
Start: 2022-04-04 — End: 2022-04-25

## 2022-04-27 ENCOUNTER — Ambulatory Visit: Admit: 2022-04-27 | Discharge: 2022-04-28 | Payer: PRIVATE HEALTH INSURANCE

## 2022-04-27 DIAGNOSIS — R9230 Dense breasts, unspecified: Secondary | ICD-10-CM | POA: Insufficient documentation

## 2022-04-27 DIAGNOSIS — L300 Nummular dermatitis: Principal | ICD-10-CM

## 2022-04-27 DIAGNOSIS — R928 Other abnormal and inconclusive findings on diagnostic imaging of breast: Principal | ICD-10-CM

## 2022-04-29 DIAGNOSIS — L300 Nummular dermatitis: Principal | ICD-10-CM

## 2022-04-29 DIAGNOSIS — R21 Rash and other nonspecific skin eruption: Principal | ICD-10-CM

## 2022-04-29 MED ORDER — TACROLIMUS 0.1 % TOPICAL OINTMENT
Freq: Two times a day (BID) | TOPICAL | 1 refills | 30.00000 days | Status: CP
Start: 2022-04-29 — End: 2022-06-28

## 2022-05-01 DIAGNOSIS — R110 Nausea: Principal | ICD-10-CM

## 2022-05-04 ENCOUNTER — Ambulatory Visit: Admit: 2022-05-04 | Discharge: 2022-05-05 | Payer: PRIVATE HEALTH INSURANCE

## 2022-05-04 DIAGNOSIS — R928 Other abnormal and inconclusive findings on diagnostic imaging of breast: Principal | ICD-10-CM

## 2022-05-23 DIAGNOSIS — L2084 Intrinsic (allergic) eczema: Principal | ICD-10-CM

## 2022-05-23 MED ORDER — DUPILUMAB 300 MG/2 ML SUBCUTANEOUS PEN INJECTOR
SUBCUTANEOUS | 10 refills | 0.00000 days | Status: CP
Start: 2022-05-23 — End: ?
  Filled 2022-06-03: qty 4, 14d supply, fill #0

## 2022-05-24 NOTE — Unmapped (Signed)
Inbasket  received requesting PA for Dupixent 300mg /41ml, PA was initiated via [facility] Chris Leveque (Key: [id number]) Loading dose Roderic Scarce (Key: [id number])Loading dose .

## 2022-05-25 DIAGNOSIS — L2084 Intrinsic (allergic) eczema: Principal | ICD-10-CM

## 2022-05-25 MED ORDER — CLOBETASOL 0.05 % TOPICAL OINTMENT
OPHTHALMIC | 1 refills | 0.00000 days | Status: CP
Start: 2022-05-25 — End: ?

## 2022-06-02 NOTE — Unmapped (Signed)
[facility] Specialty Medication Onboarding    Specialty Medication: DUPIXENT PEN 300 mg/2 mL Pnij (dupilumab)  Prior Authorization: Approved   Financial Assistance: Yes - copay card approved as secondary   Final Copay/Day Supply: $0 / 14 (LD)          $0 / 28 (MD)    Insurance Restrictions: Yes - max 1 month supply     Notes to Pharmacist: n/a    The triage team has completed the benefits investigation and has determined that the patient is able to fill this medication at [facility]. Please contact the patient to complete the onboarding or follow up with the prescribing physician as needed.

## 2022-06-03 MED ORDER — EMPTY CONTAINER
2 refills | 0 days
Start: 2022-06-03 — End: ?

## 2022-06-03 MED FILL — EMPTY CONTAINER: 120 days supply | Qty: 1 | Fill #0

## 2022-06-03 NOTE — Unmapped (Signed)
[facility]   Patient Onboarding/Medication Counseling    Tracie Taylor is a 41 y.o. female with eczema who I am counseling today on initiation of therapy.  I am speaking to the patient.    Was a Nurse, learning disability used for this call? No    Verified patient's date of birth / HIPAA.    Specialty medication(s) to be sent: Inflammatory Disorders: Dupixent      Non-specialty medications/supplies to be sent: sharps kit      Medications not needed at this time: na         Dupixent (dupilumab)    Medication & Administration     Dosage: Atopic dermatitis: Inject 600mg  under the skin as a loading dose followed by 300mg  every 14 days thereafter    Administration:     Dupixent Pen  1. Gather all supplies needed for injection on a clean, flat working surface: medication syringe removed from packaging, alcohol swab, sharps container, etc.  2. Look at the medication label - look for correct medication, correct dose, and check the expiration date  3. Look at the medication - the liquid in the pen should appear clear and colorless to pale yellow  4. Lay the pen on a flat surface and allow it to warm up to room temperature for at least 45 minutes  5. Select injection site - you can use the front of your thigh or your belly (but not the area 2 inches around your belly button); if someone else is giving you the injection you can also use your upper arm in the skin covering your triceps muscle  6. Prepare injection site - wash your hands and clean the skin at the injection site with an alcohol swab and let it air dry, do not touch the injection site again before the injection  7. Hold the middle of the body of the pen and gently pull the needle safety cap straight out. Be careful not to bend the needle. Do not remove until immediately prior to injection  8. Press the pen down onto the injection site at a 90 degree angle.   9. You will hear a click as the injection starts, and then a second click when the injection is ALMOST done. Keep holding the pen against the skin for 5 more seconds after the second click.   10. Check that the pen is empty by looking in the viewing window - the yellow indicator bar should be stopped, and should fill the window.   11. Remove the pen from the skin by lifting straight up.   12. Dispose of the used pen immediately in your sharps disposal container  13. If you see any blood at the injection site, press a cotton ball or gauze on the site and maintain pressure until the bleeding stops, do not rub the injection site    Adherence/Missed dose instructions:  If a dose is missed, administer within 7 days from the missed dose and then resume the original schedule. If the missed dose is not administered within 7 days, you can either wait until the next dose on the original schedule or take your dose now and resume every 14 days from the new injection date. Do not use 2 doses at the same time or extra doses.      Goals of Therapy     -Reduce symptoms of pruritus and dermatitis  -Prevent exacerbations  -Minimize therapeutic risks  -Avoidance of long-term systemic and topical glucocorticoid use  -Maintenance  of effective psychosocial functioning    Side Effects & Monitoring Parameters     Injection site reaction (redness, irritation, inflammation localized to the site of administration)  Signs of a common cold - minor sore throat, runny or stuffy nose, etc.  Recurrence of cold sores (herpes simplex)      The following side effects should be reported to the provider:  Signs of a hypersensitivity reaction - rash; hives; itching; red, swollen, blistered, or peeling skin; wheezing; tightness in the chest or throat; difficulty breathing, swallowing, or talking; swelling of the mouth, face, lips, tongue, or throat; etc.  Eye pain or irritation or any visual disturbances  Shortness of breath or worsening of breathing      Contraindications, Warnings, & Precautions     Have your bloodwork checked as you have been told by your prescriber   Birth control pills and other hormone-based birth control may not work as well to prevent pregnancy  Talk with your doctor if you are pregnant, planning to become pregnant, or breastfeeding  Discuss the possible need for holding your dose(s) of Dupixent?? when a planned procedure is scheduled with the prescriber as it may delay healing/recovery timeline       Drug/Food Interactions     Medication list reviewed in Epic. The patient was instructed to inform the care team before taking any new medications or supplements. No drug interactions identified.   Talk with you prescriber or pharmacist before receiving any live vaccinations while taking this medication and after you stop taking it    Storage, Handling Precautions, & Disposal     Store this medication in the refrigerator.  Do not freeze  If needed, you may store at room temperature for up to 14 days  Store in original packaging, protected from light  Do not shake  Dispose of used syringes in a sharps disposal container            Current Medications (including OTC/herbals), Comorbidities and Allergies     Current Outpatient Medications   Medication Sig Dispense Refill    ALPRAZolam (XANAX) 0.5 MG tablet Take 0.5-1 tablets (0.25-0.5 mg total) by mouth two (2) times a day as needed for anxiety. 10 tablet 0    clindamycin (CLEOCIN T) 1 % lotion Apply topically Two (2) times a day. 60 mL 6    clobetasoL (TEMOVATE) 0.05 % ointment Apply twice a day to stubborn eczema flares until the skin feels smooth, then discontinue. Do not apply to the skin on the face, underarms or groin. 60 g 1    dupilumab 300 mg/2 mL PnIj Inject the contents of 1 pen (300 mg total) under the skin every fourteen (14) days. Maintenance dose. 4 mL 10    gabapentin (NEURONTIN) 300 MG capsule TAKE 2-3 CAPSULES (600-900 MG TOTAL) BY MOUTH NIGHTLY. 270 capsule 3    levonorgestrel (MIRENA) 20 mcg/24 hr (5 years) IUD 1 kit by Intrauterine route once.      mupirocin (BACTROBAN) 2 % ointment Apply twice daily for areas with yellow crust and drainage. Can mix with the topical steroid. Use for 7 days, can restart if needed. 30 g 2    sertraline (ZOLOFT) 100 MG tablet Take 1 tablet (100 mg total) by mouth daily. 90 tablet 3    tacrolimus (PROTOPIC) 0.1 % ointment Apply 1 application. topically two (2) times a day. Apply to itchy, red, dry, eczema spots. Safe to use on face, underarms, groin/buttocks. Can cause irritation/discomfort during initial use. 30 g  1    triamcinolone (KENALOG) 0.1 % cream Apply topically Two (2) times a day. 80 g 3    triamcinolone (KENALOG) 0.1 % ointment Apply twice a day to affected areas of the skin until the skin feels smooth, then discontinue. Do not apply to the skin on the face, underarms or groin. 80 g 0     No current facility-administered medications for this visit.       Allergies   Allergen Reactions    Latex Rash    Meloxicam Other (See Comments) and Nausea Only     GI distress    Morphine Itching    Tramadol Other (See Comments) and Nausea Only     GI distress       Patient Active Problem List   Diagnosis    H/O: cesarean section    Chronic back pain    Lichen simplex chronicus    Hx of right rotator cuff tear    Splenosis    Melasma    S/P splenectomy    GAD (generalized anxiety disorder)    Recurrent major depressive disorder (CMS-HCC)    Eczema    History of alcohol abuse    Abnormal mammogram of left breast    Dense breast tissue on mammogram       Reviewed and up to date in Epic.    Appropriateness of Therapy     Acute infections noted within Epic:  No active infections  Patient reported infection: None    Is medication and dose appropriate based on diagnosis and infection status? Yes    Prescription has been clinically reviewed: Yes      Baseline Quality of Life Assessment      How many days over the past month did your AD  keep you from your normal activities? For example, brushing your teeth or getting up in the morning. Patient declined to answer    Financial Information     Medication Assistance provided: Prior Authorization and Copay Assistance    Anticipated copay of $0 reviewed with patient. Verified delivery address.    Delivery Information     Scheduled delivery date: 12/15 for load, 12/28 for maintenance    Expected start date: 12/15    Medication will be delivered via Same Day Courier to the prescription address in [facility].  This shipment will not require a signature.      Explained the services we provide at [facility] and that each month we would call to set up refills.  Stressed importance of returning phone calls so that we could ensure they receive their medications in time each month.  Informed patient that we should be setting up refills 7-10 days prior to when they will run out of medication.  A pharmacist will reach out to perform a clinical assessment periodically.  Informed patient that a welcome packet, containing information about our pharmacy and other support services, a Notice of Privacy Practices, and a drug information handout will be sent.      The patient or caregiver noted above participated in the development of this care plan and knows that they can request review of or adjustments to the care plan at any time.      Patient or caregiver verbalized understanding of the above information as well as how to contact the pharmacy at [phone] option 4 with any questions/concerns.  The pharmacy is open Monday through Friday 8:30am-4:30pm.  A pharmacist is available 24/7 via pager to answer  any clinical questions they may have.    Patient Specific Needs     Does the patient have any physical, cognitive, or cultural barriers? No    Does the patient have adequate living arrangements? (i.e. the ability to store and take their medication appropriately) Yes    Did you identify any home environmental safety or security hazards? No    Patient prefers to have medications discussed with  Patient     Is the patient or caregiver able to read and understand education materials at a high school level or above? Yes    Patient's primary language is  English     Is the patient high risk? No    SOCIAL DETERMINANTS OF HEALTH     At the [facility], we have learned that life circumstances - like trouble affording food, housing, utilities, or transportation can affect the health of many of our patients.   That is why we wanted to ask: are you currently experiencing any life circumstances that are negatively impacting your health and/or quality of life? Patient declined to answer    Social Determinants of Health     Financial Resource Strain: Low Risk  (09/15/2021)    Overall Financial Resource Strain (CARDIA)     Difficulty of Paying Living Expenses: Not hard at all   Internet Connectivity: Not on file   Food Insecurity: No Food Insecurity (09/15/2021)    Hunger Vital Sign     Worried About Running Out of Food in the Last Year: Never true     Ran Out of Food in the Last Year: Never true   Tobacco Use: Medium Risk (05/25/2022)    Patient History     Smoking Tobacco Use: Former     Smokeless Tobacco Use: Never     Passive Exposure: Not on file   Housing/Utilities: Low Risk  (09/15/2021)    Housing/Utilities     Within the past 12 months, have you ever stayed: outside, in a car, in a tent, in an overnight shelter, or temporarily in someone else's home (i.e. couch-surfing)?: No     Are you worried about losing your housing?: No     Within the past 12 months, have you been unable to get utilities (heat, electricity) when it was really needed?: No   Alcohol Use: Heavy Drinker (09/07/2020)    Alcohol Use     How often do you have a drink containing alcohol?: 4+ times per week     How many drinks containing alcohol do you have on a typical day when you are drinking?: 5 - 6     How often do you have 5 or more drinks on one occasion?: Daily or almost daily   Transportation Needs: No Transportation Needs (09/15/2021)    PRAPARE - Transportation     Lack of Transportation (Medical): No     Lack of Transportation (Non-Medical): No   Substance Use: Medium Risk (09/07/2020)    Substance Use     Taken prescription drugs for non-medical reasons: Not on file     Taken illegal drugs: Daily or Almost Daily     Patient indicated they have taken drugs in the past year for non-medical reasons: Yes, [positive answer(s)]: Yes   Health Literacy: Low Risk  (09/11/2021)    Health Literacy     : Never   Physical Activity: Not on file   Interpersonal Safety: Not on file   Stress: Not on file   Intimate Partner Violence: Not  At Risk (01/24/2018)    Humiliation, Afraid, Rape, and Kick questionnaire     Fear of Current or Ex-Partner: No     Emotionally Abused: No     Physically Abused: No     Sexually Abused: No   Depression: At risk (08/06/2020)    PHQ-2     PHQ-2 Score: 6   Social Connections: Unknown (01/24/2018)    Social Connection and Isolation Panel [NHANES]     Frequency of Communication with Friends and Family: Not on file     Frequency of Social Gatherings with Friends and Family: Not on file     Attends Religious Services: Not on file     Active Member of Clubs or Organizations: Not on file     Attends Banker Meetings: Not on file     Marital Status: Married       Would you be willing to receive help with any of the needs that you have identified today? Not applicable       Laveah Gloster A Desiree Lucy Shared [facility] Specialty Pharmacist

## 2022-06-06 ENCOUNTER — Ambulatory Visit: Admit: 2022-06-06 | Discharge: 2022-06-07 | Payer: PRIVATE HEALTH INSURANCE

## 2022-06-06 DIAGNOSIS — E785 Hyperlipidemia, unspecified: Secondary | ICD-10-CM | POA: Insufficient documentation

## 2022-06-06 DIAGNOSIS — Z23 Encounter for immunization: Principal | ICD-10-CM

## 2022-06-06 DIAGNOSIS — R7301 Impaired fasting glucose: Principal | ICD-10-CM

## 2022-06-06 DIAGNOSIS — Z9081 Acquired absence of spleen: Principal | ICD-10-CM

## 2022-06-06 DIAGNOSIS — Z0000 Encounter for general adult medical examination without abnormal findings: Principal | ICD-10-CM

## 2022-06-06 DIAGNOSIS — F411 Generalized anxiety disorder: Principal | ICD-10-CM

## 2022-06-06 DIAGNOSIS — R928 Other abnormal and inconclusive findings on diagnostic imaging of breast: Principal | ICD-10-CM

## 2022-06-06 LAB — CBC W/ AUTO DIFF
BASOPHILS ABSOLUTE COUNT: 0.1 10*9/L (ref 0.0–0.1)
BASOPHILS RELATIVE PERCENT: 0.9 %
EOSINOPHILS ABSOLUTE COUNT: 0.3 10*9/L (ref 0.0–0.5)
EOSINOPHILS RELATIVE PERCENT: 4.5 %
HEMATOCRIT: 43.2 % (ref 34.0–44.0)
HEMOGLOBIN: 14.7 g/dL (ref 11.3–14.9)
LYMPHOCYTES ABSOLUTE COUNT: 1.9 10*9/L (ref 1.1–3.6)
LYMPHOCYTES RELATIVE PERCENT: 25.4 %
MEAN CORPUSCULAR HEMOGLOBIN CONC: 33.9 g/dL (ref 32.0–36.0)
MEAN CORPUSCULAR HEMOGLOBIN: 31.8 pg (ref 25.9–32.4)
MEAN CORPUSCULAR VOLUME: 93.8 fL (ref 77.6–95.7)
MEAN PLATELET VOLUME: 7.5 fL (ref 6.8–10.7)
MONOCYTES ABSOLUTE COUNT: 0.7 10*9/L (ref 0.3–0.8)
MONOCYTES RELATIVE PERCENT: 9.3 %
NEUTROPHILS ABSOLUTE COUNT: 4.4 10*9/L (ref 1.8–7.8)
NEUTROPHILS RELATIVE PERCENT: 59.9 %
NUCLEATED RED BLOOD CELLS: 0 /100{WBCs} (ref <=4)
PLATELET COUNT: 509 10*9/L — ABNORMAL HIGH (ref 150–450)
RED BLOOD CELL COUNT: 4.61 10*12/L (ref 3.95–5.13)
RED CELL DISTRIBUTION WIDTH: 13.3 % (ref 12.2–15.2)
WBC ADJUSTED: 7.3 10*9/L (ref 3.6–11.2)

## 2022-06-06 LAB — LIPID PANEL
CHOLESTEROL/HDL RATIO SCREEN: 3.7 (ref 1.0–4.5)
CHOLESTEROL: 214 mg/dL — ABNORMAL HIGH (ref <=200)
HDL CHOLESTEROL: 58 mg/dL (ref 40–60)
LDL CHOLESTEROL CALCULATED: 114 mg/dL — ABNORMAL HIGH (ref 40–99)
NON-HDL CHOLESTEROL: 156 mg/dL — ABNORMAL HIGH (ref 70–130)
TRIGLYCERIDES: 211 mg/dL — ABNORMAL HIGH (ref 0–150)
VLDL CHOLESTEROL CAL: 42.2 mg/dL — ABNORMAL HIGH (ref 9–37)

## 2022-06-06 LAB — COMPREHENSIVE METABOLIC PANEL
ALBUMIN: 3.8 g/dL (ref 3.4–5.0)
ALKALINE PHOSPHATASE: 48 U/L (ref 46–116)
ALT (SGPT): 17 U/L (ref 10–49)
ANION GAP: 5 mmol/L (ref 5–14)
AST (SGOT): 25 U/L (ref <=34)
BILIRUBIN TOTAL: 0.8 mg/dL (ref 0.3–1.2)
BLOOD UREA NITROGEN: 9 mg/dL (ref 9–23)
BUN / CREAT RATIO: 14
CALCIUM: 9.3 mg/dL (ref 8.7–10.4)
CHLORIDE: 107 mmol/L (ref 98–107)
CO2: 25.8 mmol/L (ref 20.0–31.0)
CREATININE: 0.66 mg/dL
EGFR CKD-EPI (2021) FEMALE: >90 mL/min/{1.73_m2} (ref >=60)
GLUCOSE RANDOM: 102 mg/dL — ABNORMAL HIGH (ref 70–99)
POTASSIUM: 3.9 mmol/L (ref 3.4–4.8)
PROTEIN TOTAL: 7.1 g/dL (ref 5.7–8.2)
SODIUM: 138 mmol/L (ref 135–145)

## 2022-06-06 LAB — HEMOGLOBIN A1C
ESTIMATED AVERAGE GLUCOSE: 97 mg/dL
HEMOGLOBIN A1C: 5 % (ref 4.8–5.6)

## 2022-06-06 MED ORDER — ALPRAZOLAM 0.5 MG TABLET
ORAL_TABLET | Freq: Two times a day (BID) | ORAL | 0 refills | 5 days | Status: CP | PRN
Start: 2022-06-06 — End: 2023-06-06

## 2022-06-06 NOTE — Unmapped (Signed)
[facility] at [facility] Visit    Patient ID: Tracie Taylor is a 41 y.o. female who is here for:  Annual Exam (No concerns )      Assessment/Plan:       Diagnosis ICD-10-CM Associated Orders   1. Annual physical exam  Z00.00       2. Dyslipidemia  E78.5 CBC w/ Differential     Comprehensive Metabolic Panel     Lipid Panel      3. Need for vaccination  Z23 INFLUENZA VACCINE (QUAD) IM - 6 MO-ADULT(PF)     PNEUMOCOCCAL CONJUGATE VACCINE 20-VALENT      4. GAD (generalized anxiety disorder)  F41.1 ALPRAZolam (XANAX) 0.5 MG tablet      5. 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  Physical exam is unremarkable.     Above lab work ordered for patient.    Patient is up-to-date on Pap smear and mammogram.  Patient was reminded that she is due for another left mammogram in approximately 5 months.  Patient has had pneumonia 23.  Patient is due for pneumonia 20 today due to her splenectomy history.  Patient is also due for flu shot.  Patient was also advised that she is due for her second Menactra and second Menveo vaccination.  We will hold off on her meningitis vaccines until 1 month because of concerns with interaction with the pneumonia vaccination.    Patient's anxiety is under control on sertraline.  Patient needs refills on her Xanax today.I have reviewed this patients PDMP report. There are no concerning findings on this patients report at this time.      Encouraged healthy diet and regular exercise.       There are no Patient Instructions on file for this visit.      Medication risks and benefits provided to patient.     -- Patient verbalized an understanding of today's assessment and recommendations, as well as the purpose of ongoing medications.    Return in about 6 months (around 12/06/2022) for Follow-up Visit, Mental health f/up, mammo.            [place] Elli Groesbeck, DO  [facility] at [place], [place]      Subjective: HPI:    Patient is here for annual physical.  Patient reports that she is doing okay with diet.  Patient is eating less fast food and is eating more regularly at home cooking home-cooked meals.  Patient still admits to drinking soda pop and other sugars.  Patient is not exercising regularly.    Patient has a history of abnormal mammogram.  Patient will be due for another mammogram in about 6 months.    Patient is up-to-date on her Pap smear.    Patient has a history of spleen removal.  Patient is due for some updated vaccinations.    REVIEW OF SYSTEMS:     Review of Systems   Constitutional:  Negative for chills and fever.   HENT:  Negative for congestion and sore throat.    Eyes:  Negative for pain.   Respiratory:  Negative for cough and shortness of breath.    Cardiovascular:  Negative for chest pain.   Gastrointestinal:  Negative for abdominal pain and blood in stool.   Genitourinary:  Negative for dysuria.   Musculoskeletal:  Negative for back pain.   Skin:  Negative for rash.   Neurological:  Negative  for weakness, numbness and headaches.   Psychiatric/Behavioral:  Negative for dysphoric mood.         HISTORY:     History was reviewed and electronic chart updated:     Past Medical History:   Diagnosis Date    Anemia     Anxiety     Bursitis     Injury of posterior cruciate ligament     right knee    Ovarian cyst     Shoulder injury        Outpatient Medications Prior to Visit   Medication Sig Dispense Refill    ALPRAZolam (XANAX) 0.5 MG tablet Take 0.5-1 tablets (0.25-0.5 mg total) by mouth two (2) times a day as needed for anxiety. 10 tablet 0    clindamycin (CLEOCIN T) 1 % lotion Apply topically Two (2) times a day. 60 mL 6    clobetasoL (TEMOVATE) 0.05 % ointment Apply twice a day to stubborn eczema flares until the skin feels smooth, then discontinue. Do not apply to the skin on the face, underarms or groin. 60 g 1    dupilumab 300 mg/2 mL PnIj Inject the contents of 1 pen (300 mg total) under the skin every fourteen (14) days. Maintenance dose. 4 mL 10    empty container Misc Use as directed to dispose of Dupixent pens. 1 each 2    gabapentin (NEURONTIN) 300 MG capsule TAKE 2-3 CAPSULES (600-900 MG TOTAL) BY MOUTH NIGHTLY. 270 capsule 3    levonorgestrel (MIRENA) 20 mcg/24 hr (5 years) IUD 1 kit by Intrauterine route once.      mupirocin (BACTROBAN) 2 % ointment Apply twice daily for areas with yellow crust and drainage. Can mix with the topical steroid. Use for 7 days, can restart if needed. 30 g 2    sertraline (ZOLOFT) 100 MG tablet Take 1 tablet (100 mg total) by mouth daily. 90 tablet 3    tacrolimus (PROTOPIC) 0.1 % ointment Apply 1 application. topically two (2) times a day. Apply to itchy, red, dry, eczema spots. Safe to use on face, underarms, groin/buttocks. Can cause irritation/discomfort during initial use. 30 g 1    triamcinolone (KENALOG) 0.1 % cream Apply topically Two (2) times a day. 80 g 3    triamcinolone (KENALOG) 0.1 % ointment Apply twice a day to affected areas of the skin until the skin feels smooth, then discontinue. Do not apply to the skin on the face, underarms or groin. 80 g 0     No facility-administered medications prior to visit.        Allergies   Allergen Reactions    Latex Rash    Meloxicam Other (See Comments) and Nausea Only     GI distress    Morphine Itching    Tramadol Other (See Comments) and Nausea Only     GI distress       Social History     Socioeconomic History    Marital status: Married     Spouse name: Freida Busman    Number of children: 2   Tobacco Use    Smoking status: Former     Current packs/day: 0.00     Average packs/day: 1 pack/day for 15.0 years (15.0 ttl pk-yrs)     Types: Cigarettes     Start date: 06/28/1991     Quit date: 06/27/2006     Years since quitting: 15.9    Smokeless tobacco: Never   Vaping Use    Vaping Use: Never used  Substance and Sexual Activity    Alcohol use: Yes     Alcohol/week: 3.0 standard drinks of alcohol     Types: 3 Shots of liquor per week     Comment: pt average about 2-3 drinks/week. doing much better    Drug use: Yes     Types: Marijuana     Comment: daily    Sexual activity: Yes     Partners: Male     Birth control/protection: I.U.D.   Other Topics Concern    Do you use sunscreen? Yes    Tanning bed use? No    Are you easily burned? Yes    Excessive sun exposure? No    Blistering sunburns? No   Social History Narrative    Lives with husband, son, and daughter In [place]    Self employed Environmental manager     Social Determinants of Health     Financial Resource Strain: Low Risk  (09/15/2021)    Overall Financial Resource Strain (CARDIA)     Difficulty of Paying Living Expenses: Not hard at all   Food Insecurity: No Food Insecurity (09/15/2021)    Hunger Vital Sign     Worried About Running Out of Food in the Last Year: Never true     Ran Out of Food in the Last Year: Never true   Transportation Needs: No Transportation Needs (09/15/2021)    PRAPARE - Therapist, art (Medical): No     Lack of Transportation (Non-Medical): No   Social Connections: Unknown (01/24/2018)    Social Connection and Isolation Panel [NHANES]     Marital Status: Married       Objective:     Visit Vitals  BP 118/80   Pulse 71   Temp 35.9 ??C (96.7 ??F) (Temporal)   Wt 70.8 kg (156 lb)   SpO2 96%   BMI 27.63 kg/m??       Blood Pressure for the past 24 hrs:   BP   06/06/22 0813 118/80       There were no vitals filed for this visit.       PHYSICAL EXAM:    Physical Exam  Vitals and nursing note reviewed.   Constitutional:       General: She is not in acute distress.     Appearance: Normal appearance.   HENT:      Head: Normocephalic and atraumatic.      Right Ear: Tympanic membrane, ear canal and external ear normal.      Left Ear: Tympanic membrane, ear canal and external ear normal.      Nose: No congestion.      Mouth/Throat:      Mouth: Mucous membranes are moist. No oral lesions.      Dentition: Normal dentition.      Pharynx: No oropharyngeal exudate or posterior oropharyngeal erythema.      Tonsils: No tonsillar exudate.   Eyes:      General: Lids are normal.         Right eye: No discharge.         Left eye: No discharge.      Conjunctiva/sclera: Conjunctivae normal.      Pupils: Pupils are equal, round, and reactive to light.   Neck:      Thyroid: No thyroid mass or thyromegaly.   Cardiovascular:      Rate and Rhythm: Normal rate and regular rhythm.      Heart sounds: Normal heart sounds.  Pulmonary:      Effort: Pulmonary effort is normal. No respiratory distress.      Breath sounds: Normal breath sounds.   Abdominal:      General: Bowel sounds are normal. There is no distension.      Palpations: Abdomen is soft.      Tenderness: There is no abdominal tenderness.   Musculoskeletal:         General: Normal range of motion.      Cervical back: Normal range of motion and neck supple.      Right lower leg: No edema.      Left lower leg: No edema.   Skin:     General: Skin is warm and dry.   Neurological:      General: No focal deficit present.      Mental Status: She is alert and oriented to person, place, and time.   Psychiatric:         Mood and Affect: Mood normal.         Behavior: Behavior normal.              Note - This record has been created using [employer]. Chart creation errors have been sought, but may not always have been located. Such creation errors do not reflect on the standard of medical care.

## 2022-06-06 NOTE — Unmapped (Signed)
[facility] Clinical Intervention    Type of intervention: Medication storage issue    Medication involved: Dupixent    Problem identified: Patient reports one pen was clear, one was milky. She did not feel comfortable injecting.     Intervention performed: I had Nila check pens now - both are clear and seem to have full dose in each pen. No particulates seen.     I advised OK to inject - it's possible one pen had condensation built up on outside of window making liquid appear cloudy.    Follow-up needed: na    Approximate time spent: 5-10 minutes    Clinical evidence used to support intervention: Professional judgement    Result of the intervention: Improved medication adherence    Manette Doto A Desiree Lucy [facility] Specialty Pharmacist

## 2022-06-06 NOTE — Unmapped (Signed)
Patient in clinic for office visit, FluLaval IIV4 Pres-Free and Prevnar 20 vaccine administered per protocol,NCIR reviewed and vaccine accuracy verified with teammates: Jeri Lager, RN, patient eligible to receive vacstock: Private Vaccine, vaccine administered Right Deltoid and Left Deltoid, pt tolerated well, no s/s of a reaction noted, VIS given to patient

## 2022-06-07 DIAGNOSIS — R7989 Other specified abnormal findings of blood chemistry: Secondary | ICD-10-CM | POA: Insufficient documentation

## 2022-06-16 MED FILL — DUPIXENT 300 MG/2 ML SUBCUTANEOUS PEN INJECTOR: SUBCUTANEOUS | 28 days supply | Qty: 4 | Fill #0

## 2022-07-06 NOTE — Unmapped (Addendum)
I reviewed this patient case and all documentation provided by the learner and was readily available for consultation during their interaction with the patient.  I agree with the assessment and plan listed below. (Allergies not marked as reviewed)    [facility] Specialty Pharmacist      [facility] Clinical Assessment & Refill Coordination Note    Tracie Taylor, [place]: 1981-05-07  Phone: [phone] (home)     All above HIPAA information was verified with patient.     Was a Nurse, learning disability used for this call? No    Specialty Medication(s):   Inflammatory Disorders: Dupixent     Current Outpatient Medications   Medication Sig Dispense Refill    ALPRAZolam (XANAX) 0.5 MG tablet Take 0.5-1 tablets (0.25-0.5 mg total) by mouth two (2) times a day as needed for anxiety. 10 tablet 0    clindamycin (CLEOCIN T) 1 % lotion Apply topically Two (2) times a day. 60 mL 6    clobetasoL (TEMOVATE) 0.05 % ointment Apply twice a day to stubborn eczema flares until the skin feels smooth, then discontinue. Do not apply to the skin on the face, underarms or groin. 60 g 1    dupilumab 300 mg/2 mL PnIj Inject the contents of 1 pen (300 mg total) under the skin every fourteen (14) days. Maintenance dose. 4 mL 10    empty container Misc Use as directed to dispose of Dupixent pens. 1 each 2    gabapentin (NEURONTIN) 300 MG capsule TAKE 2-3 CAPSULES (600-900 MG TOTAL) BY MOUTH NIGHTLY. 270 capsule 3    levonorgestrel (MIRENA) 20 mcg/24 hr (5 years) IUD 1 kit by Intrauterine route once.      mupirocin (BACTROBAN) 2 % ointment Apply twice daily for areas with yellow crust and drainage. Can mix with the topical steroid. Use for 7 days, can restart if needed. 30 g 2    sertraline (ZOLOFT) 100 MG tablet Take 1 tablet (100 mg total) by mouth daily. 90 tablet 3    triamcinolone (KENALOG) 0.1 % cream Apply topically Two (2) times a day. 80 g 3    triamcinolone (KENALOG) 0.1 % ointment Apply twice a day to affected areas of the skin until the skin feels smooth, then discontinue. Do not apply to the skin on the face, underarms or groin. 80 g 0     No current facility-administered medications for this visit.        Changes to medications: Wynonia reports no changes at this time.    Allergies   Allergen Reactions    Latex Rash    Meloxicam Other (See Comments) and Nausea Only     GI distress    Morphine Itching    Tramadol Other (See Comments) and Nausea Only     GI distress       Changes to allergies: No    SPECIALTY MEDICATION ADHERENCE     Dupixent 300  mg/24mL : 12 days of medicine on hand       Medication Adherence    Patient reported X missed doses in the last month: 0  Specialty Medication: Dupixent 300 mg/49mL  Informant: patient                            Specialty medication(s) dose(s) confirmed: Regimen is correct and unchanged.     Are there any concerns with adherence? No  Adherence counseling provided? Not needed    CLINICAL MANAGEMENT AND INTERVENTION      Clinical Benefit Assessment:    Do you feel the medicine is effective or helping your condition? Yes    Clinical Benefit counseling provided? Not needed    Adverse Effects Assessment:    Are you experiencing any side effects? Yes, patient reports experiencing welting at site of injection during last injection (07/04/22). Side effect counseling provided: Discussed that this is normal to have a small amount of inflammation around the injection site and to monitor it for any worsening (Signs of a hypersensitivity reaction - spreading rash; hives; itching; difficulty breathing, swallowing, or talking; swelling of the mouth, face, lips, tongue, or throat; etc.). Patient plans to message her dermatologist to let them know. Also advised to switch legs for each injection (she previously was only using one leg).    Are you experiencing difficulty administering your medicine? No    Quality of Life Assessment:    Quality of Life Rheumatology  Oncology  Dermatology  1. What impact has your specialty medication had on the symptoms of your skin condition (i.e. itchiness, soreness, stinging)?: Some  2. What impact has your specialty medication had on your comfort level with your skin?: Some  Cystic Fibrosis          How many days over the past month did your eczema  keep you from your normal activities? For example, brushing your teeth or getting up in the morning. 0    Have you discussed this with your provider? Not needed    Acute Infection Status:    Acute infections noted within Epic:  No active infections  Patient reported infection: None    Therapy Appropriateness:    Is therapy appropriate and patient progressing towards therapeutic goals? Yes, therapy is appropriate and should be continued    DISEASE/MEDICATION-SPECIFIC INFORMATION      For patients on injectable medications: Patient currently has 0 doses left.  Next injection is scheduled for 07/18/22.    Chronic Inflammatory Diseases: Have you experienced any flares in the last month? No    PATIENT SPECIFIC NEEDS     Does the patient have any physical, cognitive, or cultural barriers? No    Is the patient high risk? No    Did the patient require a clinical intervention? No    Does the patient require physician intervention or other additional services (i.e., nutrition, smoking cessation, social work)? No    SOCIAL DETERMINANTS OF HEALTH     At the [facility], we have learned that life circumstances - like trouble affording food, housing, utilities, or transportation can affect the health of many of our patients.   That is why we wanted to ask: are you currently experiencing any life circumstances that are negatively impacting your health and/or quality of life? Patient declined to answer    Social Determinants of Health     Financial Resource Strain: Low Risk  (09/15/2021)    Overall Financial Resource Strain (CARDIA)     Difficulty of Paying Living Expenses: Not hard at all Internet Connectivity: Not on file   Food Insecurity: No Food Insecurity (09/15/2021)    Hunger Vital Sign     Worried About Running Out of Food in the Last Year: Never true     Ran Out of Food in the Last Year: Never true   Tobacco Use: Medium Risk (06/06/2022)    Patient History     Smoking Tobacco Use: Former  Smokeless Tobacco Use: Never     Passive Exposure: Not on file   Housing/Utilities: Low Risk  (09/15/2021)    Housing/Utilities     Within the past 12 months, have you ever stayed: outside, in a car, in a tent, in an overnight shelter, or temporarily in someone else's home (i.e. couch-surfing)?: No     Are you worried about losing your housing?: No     Within the past 12 months, have you been unable to get utilities (heat, electricity) when it was really needed?: No   Alcohol Use: Heavy Drinker (09/07/2020)    Alcohol Use     How often do you have a drink containing alcohol?: 4+ times per week     How many drinks containing alcohol do you have on a typical day when you are drinking?: 5 - 6     How often do you have 5 or more drinks on one occasion?: Daily or almost daily   Transportation Needs: No Transportation Needs (09/15/2021)    PRAPARE - Transportation     Lack of Transportation (Medical): No     Lack of Transportation (Non-Medical): No   Substance Use: Medium Risk (09/07/2020)    Substance Use     Taken prescription drugs for non-medical reasons: Not on file     Taken illegal drugs: Daily or Almost Daily     Patient indicated they have taken drugs in the past year for non-medical reasons: Yes, [positive answer(s)]: Yes   Health Literacy: Low Risk  (09/11/2021)    Health Literacy     : Never   Physical Activity: Not on file   Interpersonal Safety: Not on file   Stress: Not on file   Intimate Partner Violence: Not At Risk (01/24/2018)    Humiliation, Afraid, Rape, and Kick questionnaire     Fear of Current or Ex-Partner: No     Emotionally Abused: No     Physically Abused: No     Sexually Abused: No Depression: At risk (08/06/2020)    PHQ-2     PHQ-2 Score: 6   Social Connections: Unknown (01/24/2018)    Social Connection and Isolation Panel [NHANES]     Frequency of Communication with Friends and Family: Not on file     Frequency of Social Gatherings with Friends and Family: Not on file     Attends Religious Services: Not on file     Active Member of Clubs or Organizations: Not on file     Attends Banker Meetings: Not on file     Marital Status: Married       Would you be willing to receive help with any of the needs that you have identified today? Not applicable       SHIPPING     Specialty Medication(s) to be Shipped:   Inflammatory Disorders: Dupixent    Other medication(s) to be shipped: No additional medications requested for fill at this time     Changes to insurance: No    Delivery Scheduled: Yes, Expected medication delivery date: 07/12/22.     Medication will be delivered via UPS to the confirmed prescription address in [place].    The patient will receive a drug information handout for each medication shipped and additional FDA Medication Guides as required.  Verified that patient has previously received a Conservation officer, historic buildings and a Surveyor, mining.    The patient or caregiver noted above participated in the development of this care plan and knows  that they can request review of or adjustments to the care plan at any time.      All of the patient's questions and concerns have been addressed.    Philomena Doheny, PharmD   [facility] Specialty Pharmacist

## 2022-07-11 MED FILL — DUPIXENT 300 MG/2 ML SUBCUTANEOUS PEN INJECTOR: SUBCUTANEOUS | 28 days supply | Qty: 4 | Fill #1

## 2022-08-12 NOTE — Unmapped (Signed)
[facility] Refill Coordination Note    Specialty Medication(s) to be Shipped:   Inflammatory Disorders: Dupixent    Other medication(s) to be shipped: No additional medications requested for fill at this time     Tracie Taylor, DOB: Jan 03, 1981  Phone: [phone] (home)       All above HIPAA information was verified with patient.     Was a Nurse, learning disability used for this call? No    Completed refill call assessment today to schedule patient's medication shipment from the [facility] [phone]).  All relevant notes have been reviewed.     Specialty medication(s) and dose(s) confirmed: Regimen is correct and unchanged.   Changes to medications: [name] reports no changes at this time.  Changes to insurance: No  New side effects reported not previously addressed with a pharmacist or physician: None reported  Questions for the pharmacist: No    Confirmed patient received a Conservation officer, historic buildings and a Surveyor, mining with first shipment. The patient will receive a drug information handout for each medication shipped and additional FDA Medication Guides as required.       DISEASE/MEDICATION-SPECIFIC INFORMATION        For patients on injectable medications: Patient currently has 0 doses left.  Next injection is scheduled for 08/15/22.    SPECIALTY MEDICATION ADHERENCE     Medication Adherence    Patient reported X missed doses in the last month: 0  Specialty Medication: Dupixent  Patient is on additional specialty medications: No  Patient is on more than two specialty medications: No              Were doses missed due to medication being on hold? No    DUPIXENT PEN 300 mg/2  mg/ml: 0 days of medicine on hand       REFERRAL TO PHARMACIST     Referral to the pharmacist: Not needed      [facility]     Shipping address confirmed in Epic.     Patient was notified of new phone menu : No    Delivery Scheduled: Yes, Expected medication delivery date: 08/16/22.     Medication will be delivered via UPS to the prescription address in Epic [facility].    [name]   [facility] Specialty Technician

## 2022-08-15 MED FILL — DUPIXENT 300 MG/2 ML SUBCUTANEOUS PEN INJECTOR: SUBCUTANEOUS | 28 days supply | Qty: 4 | Fill #2

## 2022-08-19 MED FILL — DUPIXENT 300 MG/2 ML SUBCUTANEOUS PEN INJECTOR: SUBCUTANEOUS | 28 days supply | Qty: 4 | Fill #3

## 2022-08-23 ENCOUNTER — Emergency Department
Admit: 2022-08-23 | Discharge: 2022-08-23 | Disposition: A | Discharge status: Home / Self Care | Payer: PRIVATE HEALTH INSURANCE | Attending: Family

## 2022-08-23 ENCOUNTER — Ambulatory Visit
Admit: 2022-08-23 | Discharge: 2022-08-23 | Disposition: A | Discharge status: Home / Self Care | Payer: PRIVATE HEALTH INSURANCE | Attending: Family

## 2022-08-23 DIAGNOSIS — R079 Chest pain, unspecified: Principal | ICD-10-CM

## 2022-08-23 LAB — CBC W/ AUTO DIFF
BASOPHILS ABSOLUTE COUNT: 0.1 10*9/L (ref 0.0–0.1)
BASOPHILS RELATIVE PERCENT: 1.3 %
EOSINOPHILS ABSOLUTE COUNT: 0.3 10*9/L (ref 0.0–0.5)
EOSINOPHILS RELATIVE PERCENT: 3.1 %
HEMATOCRIT: 45.4 % — ABNORMAL HIGH (ref 34.0–44.0)
HEMOGLOBIN: 15.6 g/dL — ABNORMAL HIGH (ref 11.3–14.9)
LYMPHOCYTES ABSOLUTE COUNT: 2.4 10*9/L (ref 1.1–3.6)
LYMPHOCYTES RELATIVE PERCENT: 24.1 %
MEAN CORPUSCULAR HEMOGLOBIN CONC: 34.4 g/dL (ref 32.0–36.0)
MEAN CORPUSCULAR HEMOGLOBIN: 32.3 pg (ref 25.9–32.4)
MEAN CORPUSCULAR VOLUME: 93.9 fL (ref 77.6–95.7)
MEAN PLATELET VOLUME: 7.4 fL (ref 6.8–10.7)
MONOCYTES ABSOLUTE COUNT: 0.9 10*9/L — ABNORMAL HIGH (ref 0.3–0.8)
MONOCYTES RELATIVE PERCENT: 9.2 %
NEUTROPHILS ABSOLUTE COUNT: 6.3 10*9/L (ref 1.8–7.8)
NEUTROPHILS RELATIVE PERCENT: 62.3 %
NUCLEATED RED BLOOD CELLS: 0 /100{WBCs} (ref <=4)
PLATELET COUNT: 484 10*9/L — ABNORMAL HIGH (ref 150–450)
RED BLOOD CELL COUNT: 4.83 10*12/L (ref 3.95–5.13)
RED CELL DISTRIBUTION WIDTH: 14.5 % (ref 12.2–15.2)
WBC ADJUSTED: 10.2 10*9/L (ref 3.6–11.2)

## 2022-08-23 LAB — COMPREHENSIVE METABOLIC PANEL
ALBUMIN: 4 g/dL (ref 3.4–5.0)
ALKALINE PHOSPHATASE: 64 U/L (ref 46–116)
ALT (SGPT): 15 U/L (ref 10–49)
ANION GAP: 8 mmol/L (ref 5–14)
AST (SGOT): 23 U/L (ref <=34)
BILIRUBIN TOTAL: 0.7 mg/dL (ref 0.3–1.2)
BLOOD UREA NITROGEN: 8 mg/dL — ABNORMAL LOW (ref 9–23)
BUN / CREAT RATIO: 13
CALCIUM: 9.1 mg/dL (ref 8.7–10.4)
CHLORIDE: 108 mmol/L — ABNORMAL HIGH (ref 98–107)
CO2: 21.9 mmol/L (ref 20.0–31.0)
CREATININE: 0.64 mg/dL
EGFR CKD-EPI (2021) FEMALE: >90 mL/min/{1.73_m2} (ref >=60)
GLUCOSE RANDOM: 97 mg/dL (ref 70–179)
POTASSIUM: 4.1 mmol/L (ref 3.4–4.8)
PROTEIN TOTAL: 7.5 g/dL (ref 5.7–8.2)
SODIUM: 138 mmol/L (ref 135–145)

## 2022-08-23 LAB — LIPASE: LIPASE: 35 U/L (ref 12–53)

## 2022-08-23 LAB — HIGH SENSITIVITY TROPONIN I - 2 HOUR SERIAL
HIGH SENSITIVITY TROPONIN - DELTA (0-2H): 0 ng/L (ref <=7)
HIGH-SENSITIVITY TROPONIN I - 2 HOUR: <3 ng/L (ref <=34)

## 2022-08-23 LAB — HIGH SENSITIVITY TROPONIN I - SERIAL: HIGH SENSITIVITY TROPONIN I: <3 ng/L (ref <=34)

## 2022-08-23 MED ORDER — OMEPRAZOLE 20 MG CAPSULE,DELAYED RELEASE
ORAL_CAPSULE | Freq: Two times a day (BID) | ORAL | 0 refills | 14 days | Status: CP
Start: 2022-08-23 — End: 2022-09-06

## 2022-08-23 MED ORDER — CYCLOBENZAPRINE 5 MG TABLET
ORAL_TABLET | Freq: Three times a day (TID) | ORAL | 0 refills | 2 days | Status: CP | PRN
Start: 2022-08-23 — End: ?

## 2022-08-23 MED ADMIN — sucralfate (CARAFATE) oral suspension: 1 g | ORAL | @ 16:00:00 | Stop: 2022-08-23

## 2022-08-23 MED ADMIN — aspirin tablet 325 mg: 325 mg | ORAL | @ 13:00:00 | Stop: 2022-08-23

## 2022-08-23 MED ADMIN — aluminum-magnesium hydroxide-simethicone (MAALOX MAX) 80-80-8 mg/mL oral suspension: 30 mL | ORAL | @ 16:00:00 | Stop: 2022-08-23

## 2022-08-23 MED ADMIN — ondansetron (ZOFRAN) injection 4 mg: 4 mg | INTRAVENOUS | @ 13:00:00 | Stop: 2022-08-23

## 2022-08-23 MED ADMIN — diazePAM (VALIUM) tablet 5 mg: 5 mg | ORAL | @ 14:00:00 | Stop: 2022-08-23

## 2022-08-23 NOTE — Unmapped (Signed)
Pt reports she got up to go to the restroom and suddenly developed sharp back pain, chest pain, became diaphoretic and started vomiting. Pt reports she is having tingling in her left fingertips.

## 2022-08-23 NOTE — Unmapped (Signed)
[facility]  Emergency Department Provider Note      ED Clinical Impression     Final diagnoses:   Chest pain, unspecified type (Primary)       Initial Impression, ED Course, Assessment and Plan     Impression: chest pain    Tracie Taylor is a 42 y.o. female with pmhx of anemia, anxiety, who presents to ED for evaluation of sudden onset upper back and chest discomfort about 7 AM this morning.  Has some associated nausea, dry heaving, diaphoresis, and bilateral hand tingling.  Has been belching quite a bit since this started.  No history of cardiac disease.  No lightheadedness or syncope.  Has not eaten this morning.    On exam, patient is mildly anxious appearing, with normal vital signs.  She is in no acute respiratory distress.  Heart sounds regular rate and rhythm, no murmurs.  Lungs are clear to auscultation bilaterally.  No crackles or wheezing on exam.  Good air movement throughout.  Abdomen soft, with some focal epigastric tenderness to palpation.  No rebound tenderness or guarding.  No peripheral edema.    Differential does include acute gastritis, esophageal spasm, GERD, ACS, less likely aortic aneurysm (normal blood pressures, no syncope), anxiety.  EKG was performed at triage and is normal sinus rhythm without signs of ischemia.  Plan for serial troponins and EKGs, basic labs, chest x-ray, will give aspirin, Zofran for nausea, as well as a Valium.    10:16 AM  Initial labs all normal, chest x-ray clear, patient feeling improved after medications.  Pending serial troponin/EKG.  Will give GI cocktail as well.    10:36 AM  Repeat EKG, troponin normal, negative delta.  Heart score of 1.  Patient feeling improved.  Plan for discharge home on omeprazole and prescription for cyclobenzaprine to take as needed for muscle soreness.  Close PCP follow-up.  Return precautions were discussed.  Patient is agreeable to this plan.    ____________________________________________    Time seen: August 23, 2022 8:06 AM    I have reviewed the triage vital signs and the nursing notes.    This visit was not staffed with an ED attending.    Additional Medical Decision Making     I have reviewed the vital signs and the nursing notes. Labs and radiology results that were available during my care of the patient were independently reviewed by me and considered in my medical decision making.   I directly visualized and independently interpreted the EKG tracing.   I independently visualized the radiology images.   I reviewed the patient's prior medical records [facility]).     History     Chief Complaint  Chest Pain and Shortness of Breath      HPI   Tracie Taylor is a 42 y.o. female with pmhx of anemia, anxiety, who presents to ED for evaluation of sudden onset upper back and chest discomfort about 7 AM this morning.  States she had woken up in her usual state of health, used the bathroom without difficulty.  Then went back to go sit on her bed and was talking with her husband when she started feeling some tightness in her upper back.  Initially thought maybe it was just muscular, but then it was radiating to her chest and she became acutely diaphoretic and nauseous with some dry heaving.  Also developed some hand tingling.  Husband then became concerned and brought her to the ED for evaluation.  States  she has never had any symptoms like this before.  Had not had anything to eat this morning.  Currently feels like she just needs to belch quite a bit.  States she does feel short of breath as she feels tight at the top of her breath as if it is catching.  No history of DVT/PE.  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  History of 2 C-sections, no other abdominal surgeries.  She does report smoking marijuana daily, and drinks bourbon couple of times a week.  No other drug use or tobacco use.      Past Medical History:   Diagnosis Date    Anemia     Anxiety     Bursitis     Injury of posterior cruciate ligament right knee    Ovarian cyst     Shoulder injury        Patient Active Problem List   Diagnosis    H/O: cesarean section    Chronic back pain    Lichen simplex chronicus    Hx of right rotator cuff tear    Splenosis    Melasma    S/P splenectomy    GAD (generalized anxiety disorder)    Recurrent major depressive disorder (CMS-HCC)    Eczema    History of alcohol abuse    Abnormal mammogram of left breast    Dense breast tissue on mammogram    Dyslipidemia    Elevated platelet count       Past Surgical History:   Procedure Laterality Date    BUNIONECTOMY      CESAREAN SECTION      X2    PR CORRJ HLX VLGS BNCTY SESMDC DSTL METAR OSTEOT Right 05/22/2019    Procedure: CORRECTION, HALLUX VALGUS (BUNIONECTOMY), WITH SESAMOIDECTOMY, WHEN PERFORMED; WITH DISTAL METATARSAL OSTEOTOMY, ANY METHOD;  Surgeon: Jearl Klinefelter, DPM;  [facility];  Service: Vascular    PR INSERT INTRAUTERINE DEVICE N/A 02/28/2019    Procedure: INSERTION OF INTRAUTERINE DEVICE (IUD) Mirena;  Surgeon: Tamsen Roers, MD PhD;  Location: [facility] OR [facility];  Service: [facility] Primary Gynecology    PR REMOVE INTRAUTERINE DEVICE N/A 02/28/2019    Procedure: REMOVAL OF INTRAUTERINE DEVICE (IUD);  Surgeon: Tamsen Roers, MD PhD;  Location: [facility] OR [facility];  Service: [facility] Primary Gynecology    PR UPPER GI ENDOSCOPY,BIOPSY N/A 01/24/2018    Procedure: UGI ENDOSCOPY; WITH BIOPSY, SINGLE OR MULTIPLE;  Surgeon: Alfred Levins, MD;  Location: [facility] PROCED[facility];  Service: Gastroenterology    SKIN BIOPSY      SPLENECTOMY N/A 1988?         Current Facility-Administered Medications:     aspirin tablet 325 mg, 325 mg, Oral, Once, Evaristo Bury, FNP    ondansetron [facility]) injection 4 mg, 4 mg, Intravenous, Once, Marcelle Overlie D, FNP    Current Outpatient Medications:     ALPRAZolam (XANAX) 0.5 MG tablet, Take 0.5-1 tablets (0.25-0.5 mg total) by mouth two (2) times a day as needed for anxiety., Disp: 10 tablet, Rfl: 0    clindamycin (CLEOCIN T) 1 % lotion, Apply topically Two (2) times a day., Disp: 60 mL, Rfl: 6    clobetasoL (TEMOVATE) 0.05 % ointment, Apply twice a day to stubborn eczema flares until the skin feels smooth, then discontinue. Do not apply to the skin on the face, underarms or groin., Disp: 60 g, Rfl: 1    dupilumab 300 mg/2 mL  PnIj, Inject the contents of 1 pen (300 mg total) under the skin every fourteen (14) days. Maintenance dose., Disp: 4 mL, Rfl: 10    empty container Misc, Use as directed to dispose of Dupixent pens., Disp: 1 each, Rfl: 2    gabapentin (NEURONTIN) 300 MG capsule, TAKE 2-3 CAPSULES (600-900 MG TOTAL) BY MOUTH NIGHTLY., Disp: 270 capsule, Rfl: 3    levonorgestrel (MIRENA) 20 mcg/24 hr (5 years) IUD, 1 kit by Intrauterine route once., Disp: , Rfl:     mupirocin (BACTROBAN) 2 % ointment, Apply twice daily for areas with yellow crust and drainage. Can mix with the topical steroid. Use for 7 days, can restart if needed., Disp: 30 g, Rfl: 2    sertraline (ZOLOFT) 100 MG tablet, Take 1 tablet (100 mg total) by mouth daily., Disp: 90 tablet, Rfl: 3    triamcinolone (KENALOG) 0.1 % cream, Apply topically Two (2) times a day., Disp: 80 g, Rfl: 3    triamcinolone (KENALOG) 0.1 % ointment, Apply twice a day to affected areas of the skin until the skin feels smooth, then discontinue. Do not apply to the skin on the face, underarms or groin., Disp: 80 g, Rfl: 0    Allergies  Latex, Meloxicam, Morphine, and Tramadol    Family History   Problem Relation Age of Onset    Cancer Mother         bone, lung, liver    Anesthesia problems Mother     Seizures Mother     Bipolar disorder Father     Broken bones Father     No Known Problems Daughter     No Known Problems Son     No Known Problems Maternal Aunt     No Known Problems Maternal Uncle     No Known Problems Paternal Aunt     No Known Problems Paternal Uncle     No Known Problems Maternal Grandmother     No Known Problems Maternal Grandfather     No Known Problems Paternal Grandmother     No Known Problems Paternal Grandfather     No Known Problems Other     Colorectal Cancer Neg Hx     Esophageal cancer Neg Hx     Liver cancer Neg Hx     Pancreatic cancer Neg Hx     Stomach cancer Neg Hx     Clotting disorder Neg Hx     Collagen disease Neg Hx     Diabetes Neg Hx     Dislocations Neg Hx     Fibromyalgia Neg Hx     Gout Neg Hx     Hemophilia Neg Hx     Osteoporosis Neg Hx     Rheumatologic disease Neg Hx     Scoliosis Neg Hx     Severe sprains Neg Hx     Sickle cell anemia Neg Hx     Spinal Compression Fracture Neg Hx     Breast cancer Neg Hx        Social History  Social History     Tobacco Use    Smoking status: Former     Current packs/day: 0.00     Average packs/day: 1 pack/day for 15.0 years (15.0 ttl pk-yrs)     Types: Cigarettes     Start date: 06/28/1991     Quit date: 06/27/2006     Years since quitting: 16.1    Smokeless tobacco: Never   Vaping Use  Vaping status: Never Used   Substance Use Topics    Alcohol use: Yes     Alcohol/week: 3.0 standard drinks of alcohol     Types: 3 Shots of liquor per week     Comment: pt average about 2-3 drinks/week. doing much better    Drug use: Yes     Types: Marijuana     Comment: daily       Review of Systems    A complete review of systems was performed and is negative other than as addressed in the HPI.    Physical Exam     ED Triage Vitals [08/23/22 0759]   Enc Vitals Group      BP 143/95      Heart Rate 89      SpO2 Pulse       Resp 24      Temp 36.7 ??C (98.1 ??F)      Temp Source Oral      SpO2 94 %      Weight 68 kg (150 lb)      Height 1.6 m (5' 3)      Head Circumference       Peak Flow       Pain Score       Pain Loc       Pain Edu?       Excl. in GC?        Constitutional: Alert and oriented. Well appearing and in no distress.  Eyes: Conjunctivae are normal.  ENT       Head: Normocephalic and atraumatic.       Mouth/Throat: Mucous membranes are moist.       Neck: Supple  Cardiovascular: Normal rate, regular rhythm.   Respiratory: Normal respiratory effort. Breath sounds are normal.  No crackles or wheezing on exam.  Gastrointestinal: Soft, with mild tenderness to palpation focally in the epigastrium only.  No rebound tenderness or guarding.  No pulsatile masses present.  There is no CVA tenderness.  Musculoskeletal: Nontender with normal range of motion in all extremities.       Right lower leg: No tenderness or edema.       Left lower leg: No tenderness or edema.  Neurologic: Normal speech and language. No gross focal neurologic deficits are appreciated.  Skin: Skin is warm, dry and intact. No rash noted.  Psychiatric: Mood and affect are normal. Speech and behavior are normal.      EKG     Normal sinus rhythm at 88 bpm  No ST elevations or depressions  No T wave inversions  Normal EKG  Unchanged from prior 03/25/2020    Radiology     XR Chest 2 views   Final Result      Negative chest.          Labs     Labs Reviewed   COMPREHENSIVE METABOLIC PANEL - Abnormal; Notable for the following components:       Result Value    Chloride 108 (*)     BUN 8 (*)     All other components within normal limits   CBC W/ AUTO DIFF - Abnormal; Notable for the following components:    HGB 15.6 (*)     HCT 45.4 (*)     Platelet 484 (*)     Absolute Monocytes 0.9 (*)     All other components within normal limits   HIGH SENSITIVITY TROPONIN I - SERIAL - Normal   LIPASE -  Normal   HIGH SENSITIVITY TROPONIN I - 2 HOUR SERIAL - Normal   CBC W/ DIFFERENTIAL    Narrative:     The following orders were created for panel order CBC w/ Differential.  Procedure                               Abnormality         Status                     ---------                               -----------         ------                     CBC w/ [id number]]         Abnormal            Final result                 Please view results for these tests on the individual orders.       Pertinent labs & imaging results that were available during my care of the patient were reviewed by me and considered in my medical decision making (see chart for details).         Marcelle Overlie D, FNP  08/23/22 1043

## 2022-09-08 NOTE — Unmapped (Signed)
[facility] Pharm[facility] Refill Coordination Note    Specialty Medication(s) to be Shipped:   Inflammatory Disorders: Dupixent    Other medication(s) to be shipped: No additional medications requested for fill at this time     Tracie Taylor, DOB: 02/21/1981  Phone: [phone] (home)       All above HIPAA information was verified with patient.     Was a Nurse, learning disability used for this call? No    Completed refill call assessment today to schedule patient's medication shipment from the [facility] [phone]).  All relevant notes have been reviewed.     Specialty medication(s) and dose(s) confirmed: Regimen is correct and unchanged.   Changes to medications: Maryela reports no changes at this time.  Changes to insurance: No  New side effects reported not previously addressed with a pharmacist or physician: Yes - Patient reports rash when injecting in right leg. Patient would not like to speak to the pharmacist today. Their provider is aware.  Questions for the pharmacist: No    Confirmed patient received a Conservation officer, historic buildings and a Surveyor, mining with first shipment. The patient will receive a drug information handout for each medication shipped and additional FDA Medication Guides as required.       DISEASE/MEDICATION-SPECIFIC INFORMATION        For patients on injectable medications: Patient currently has 1 doses left.  Next injection is scheduled for 09/08/22.    SPECIALTY MEDICATION ADHERENCE     Medication Adherence    Patient reported X missed doses in the last month: 0  Specialty Medication: Dupixent 300mg /11mL  Patient is on additional specialty medications: No  Informant: patient              Were doses missed due to medication being on hold? No    DUPIXENT PEN 300 mg/2  mg/ml: 1 days of medicine on hand       REFERRAL TO PHARMACIST     Referral to the pharmacist: Not needed      [facility]     Shipping address confirmed in Epic.     Patient was notified of new phone menu : No    Delivery Scheduled: Yes, Expected medication delivery date: 09/14/22.     Medication will be delivered via Same Day Courier to the prescription address in Epic WAM.    [name]   [facility] Specialty Technician

## 2022-09-14 MED FILL — DUPIXENT 300 MG/2 ML SUBCUTANEOUS PEN INJECTOR: SUBCUTANEOUS | 28 days supply | Qty: 4 | Fill #4

## 2022-10-03 NOTE — Unmapped (Signed)
[facility] Refill Coordination Note    Specialty Medication(s) to be Shipped:   Inflammatory Disorders: Dupixent    Other medication(s) to be shipped: No additional medications requested for fill at this time     Tracie Taylor, DOB: [birth year]  Phone: [phone] (home)       All above HIPAA information was verified with patient.     Was a Nurse, learning disability used for this call? No    Completed refill call assessment today to schedule patient's medication shipment from the [facility] [phone]).  All relevant notes have been reviewed.     Specialty medication(s) and dose(s) confirmed: Regimen is correct and unchanged.   Changes to medications: Tennille reports no changes at this time.  Changes to insurance: No  New side effects reported not previously addressed with a pharmacist or physician: None reported  Questions for the pharmacist: No    Confirmed patient received a Conservation officer, historic buildings and a Surveyor, mining with first shipment. The patient will receive a drug information handout for each medication shipped and additional FDA Medication Guides as required.       DISEASE/MEDICATION-SPECIFIC INFORMATION        For patients on injectable medications: Patient currently has 0 doses left.  Next injection is scheduled for 10/06/2022.    SPECIALTY MEDICATION ADHERENCE     Medication Adherence    Patient reported X missed doses in the last month: 0  Specialty Medication: DUPIXENT PEN 300 mg/2 mL Pnij (dupilumab)  Patient is on additional specialty medications: No  Patient is on more than two specialty medications: No  Any gaps in refill history greater than 2 weeks in the last 3 months: no  Demonstrates understanding of importance of adherence: yes  Informant: patient  Confirmed plan for next specialty medication refill: delivery by pharmacy  Refills needed for supportive medications: not needed          Refill Coordination    Is the Shipping Address Different: No Were doses missed due to medication being on hold? No    DUPIXENT PEN 300 /2  mg/ml: 0 days of medicine on hand       REFERRAL TO PHARMACIST     Referral to the pharmacist: Not needed      [facility]     Shipping address confirmed in Epic.       Delivery Scheduled: Yes, Expected medication delivery date: 10/06/2022.     Medication will be delivered via Same Day Courier to the prescription address in Epic WAM.    Kerby Less   [facility] Specialty Technician

## 2022-10-06 MED FILL — DUPIXENT 300 MG/2 ML SUBCUTANEOUS PEN INJECTOR: SUBCUTANEOUS | 28 days supply | Qty: 4 | Fill #5

## 2022-11-03 DIAGNOSIS — R928 Other abnormal and inconclusive findings on diagnostic imaging of breast: Principal | ICD-10-CM

## 2022-11-03 NOTE — Unmapped (Addendum)
Pt needs update on abnormal left mammogram from November 2023    Pt needs update with left diagnostic mammo and possible left breast ultrasound.    Orders Placed This Encounter   Procedures    Mammo Digital Diagnostic Tomo Left     Standing Status:   Future     Standing Expiration Date:   11/03/2023     Order Specific Question:   REASON FOR EXAM     Answer:   ABNORMAL MAMMOGRAM     Order Specific Question:   Reason for Exam:     Answer:   abn mammo     Order Specific Question:   Is the patient pregnant?     Answer:   No     Order Specific Question:   Performed at     Answer:   [facility]    US Breast Limited Left     Standing Status:   Future     Standing Expiration Date:   11/03/2023     Order Specific Question:   Is the patient pregnant?     Answer:   No     Order Specific Question:   Performed at     Answer:   [facility]     Order Specific Question:   What is the patient's sedation requirement?     Answer:   No Sedation     Order Specific Question:   Reason for Exam:     Answer:   abn mammo

## 2022-11-03 NOTE — Unmapped (Signed)
Called and spoke to the pt, to inform her per Dr Gaynelle Adu she needs to have her 60month following mammogram done, and an order has been placed.She stated she was in the store and requested the contact number to have it scheduled be sent to her mychart. Message has been sent

## 2022-11-04 NOTE — Unmapped (Signed)
noted 

## 2022-11-07 NOTE — Unmapped (Signed)
[facility] Refill Coordination Note    Specialty Medication(s) to be Shipped:   Inflammatory Disorders: Dupixent    Other medication(s) to be shipped: No additional medications requested for fill at this time     Tracie Taylor, DOB: 06-Mar-1981  Phone: [phone] (home)       All above HIPAA information was verified with patient.     Was a Nurse, learning disability used for this call? No    Completed refill call assessment today to schedule patient's medication shipment from the [facility] [phone]).  All relevant notes have been reviewed.     Specialty medication(s) and dose(s) confirmed: Regimen is correct and unchanged.   Changes to medications: [name] reports no changes at this time.  Changes to insurance: No  New side effects reported not previously addressed with a pharmacist or physician: None reported  Questions for the pharmacist: No    Confirmed patient received a Conservation officer, historic buildings and a Surveyor, mining with first shipment. The patient will receive a drug information handout for each medication shipped and additional FDA Medication Guides as required.       DISEASE/MEDICATION-SPECIFIC INFORMATION        For patients on injectable medications: Patient currently has 0 doses left.  Next injection is scheduled for 11/10/22.    SPECIALTY MEDICATION ADHERENCE     Medication Adherence    Specialty Medication: DUPIXENT PEN 300 mg/2 mL Pnij (dupilumab)  Patient is on additional specialty medications: No              Were doses missed due to medication being on hold? No    DUPIXENT PEN 300 mg/2 mL Pnij (dupilumab)  : 0 days of medicine on hand       REFERRAL TO PHARMACIST     Referral to the pharmacist: Not needed      [facility]     Shipping address confirmed in Epic.       Delivery Scheduled: Yes, Expected medication delivery date: 11/08/22.     Medication will be delivered via Same Day Courier to the prescription address in Epic WAM.    Tracie Taylor' W Tracie Taylor Shared [facility] Specialty Technician

## 2022-11-08 ENCOUNTER — Ambulatory Visit: Admit: 2022-11-08 | Discharge: 2022-11-09 | Payer: PRIVATE HEALTH INSURANCE

## 2022-11-08 DIAGNOSIS — M51369 Other intervertebral disc degeneration, lumbar region without mention of lumbar back pain or lower extremity pain: Secondary | ICD-10-CM | POA: Insufficient documentation

## 2022-11-08 DIAGNOSIS — M5136 Other intervertebral disc degeneration, lumbar region: Principal | ICD-10-CM

## 2022-11-08 DIAGNOSIS — M21619 Bunion of unspecified foot: Principal | ICD-10-CM

## 2022-11-08 DIAGNOSIS — F3342 Major depressive disorder, recurrent, in full remission: Principal | ICD-10-CM

## 2022-11-08 DIAGNOSIS — M545 Acute left-sided low back pain without sciatica: Principal | ICD-10-CM

## 2022-11-08 DIAGNOSIS — F411 Generalized anxiety disorder: Principal | ICD-10-CM

## 2022-11-08 DIAGNOSIS — R928 Other abnormal and inconclusive findings on diagnostic imaging of breast: Principal | ICD-10-CM

## 2022-11-08 MED ORDER — DICLOFENAC SODIUM 75 MG TABLET,DELAYED RELEASE
ORAL_TABLET | Freq: Two times a day (BID) | ORAL | 1 refills | 30 days | Status: CP
Start: 2022-11-08 — End: ?

## 2022-11-08 MED ORDER — GABAPENTIN 300 MG CAPSULE
ORAL_CAPSULE | Freq: Every evening | ORAL | 3 refills | 90 days | Status: CP
Start: 2022-11-08 — End: 2023-11-08

## 2022-11-08 MED ORDER — ALPRAZOLAM 0.5 MG TABLET
ORAL_TABLET | Freq: Two times a day (BID) | ORAL | 0 refills | 5 days | Status: CP | PRN
Start: 2022-11-08 — End: 2023-11-08

## 2022-11-08 MED ORDER — CYCLOBENZAPRINE 10 MG TABLET
ORAL_TABLET | Freq: Three times a day (TID) | ORAL | 1 refills | 10 days | Status: CP | PRN
Start: 2022-11-08 — End: ?

## 2022-11-08 MED ORDER — GABAPENTIN 100 MG CAPSULE
ORAL_CAPSULE | Freq: Two times a day (BID) | ORAL | 3 refills | 68 days | Status: CP | PRN
Start: 2022-11-08 — End: ?

## 2022-11-08 MED ORDER — SERTRALINE 100 MG TABLET
ORAL_TABLET | Freq: Every day | ORAL | 2 refills | 90 days | Status: CP
Start: 2022-11-08 — End: ?

## 2022-11-08 MED FILL — DUPIXENT 300 MG/2 ML SUBCUTANEOUS PEN INJECTOR: SUBCUTANEOUS | 28 days supply | Qty: 4 | Fill #6

## 2022-11-08 NOTE — Unmapped (Addendum)
[facility] at [facility] Visit    Patient ID: Tracie Taylor is a 42 y.o. female who is here for:  Back Pain (Left lower back pain, she was paining golf and swung to hard she has been in pain for the last 6 days, requesting something to helping with the pain )      Assessment/Plan:       Diagnosis ICD-10-CM Associated Orders   1. Acute left-sided low back pain without sciatica  M54.50 cyclobenzaprine (FLEXERIL) 10 MG tablet     diclofenac (VOLTAREN) 75 MG EC tablet      2. DDD (degenerative disc disease), lumbar  M51.36 gabapentin (NEURONTIN) 300 MG capsule     gabapentin (NEURONTIN) 100 MG capsule      3. Bunion  M21.619 Ambulatory referral to Podiatry      4. Abnormal mammogram of left breast  R92.8       5. Recurrent major depressive disorder, in full remission (CMS-HCC)  F33.42 sertraline (ZOLOFT) 100 MG tablet      6. GAD (generalized anxiety disorder)  F41.1 ALPRAZolam (XANAX) 0.5 MG tablet          Patient's with an acute lower back injury that is likely due to oblique muscle strain.  Patient was advised NSAIDs, Tylenol, Flexeril.  Patient was advised to avoid ibuprofen and naproxen while taking diclofenac.  Patient was advised that if back pain does not improve we can try physical therapy.    Patient plans to complete her abnormal mammogram    Patient's mental health is doing well on current medications.     There are no Patient Instructions on file for this visit.      Medication risks and benefits provided to patient.     -- Patient verbalized an understanding of today's assessment and recommendations, as well as the purpose of ongoing medications.    Return in about 7 months (around 06/10/2023), or if symptoms worsen or fail to improve, for Annual physical.            Italy Gabbi Whetstone, DO  [facility] at [place], [place]      Subjective:     HPI:    Patient is here for concern of lower back pain.  Patient was golfing a few days ago and when she twisted she heard the left side of her back.  Patient feels pain around the left flank.  Patient notices pain with back extension and with side bending.  Patient has not noticed much improvement.    Patient has a bunion on her foot and requesting referral to podiatry.    Patient was previously reminded that she has an abnormal mammogram of her left breast and she is due for another follow-up.    Patient reports that her mental health is doing well on her current medications.  Patient finds sertraline to be very helpful and she has not had to use her Xanax too often.  Patient reports having about 3 tablets of Xanax left over.    Patient continues to use gabapentin regularly for chronic back pain issues.  Patient would like refills today.    REVIEW OF SYSTEMS:     Review of Systems   Constitutional:  Negative for chills and fever.   Respiratory:  Negative for shortness of breath.    Cardiovascular:  Negative for chest pain.   Musculoskeletal:  Positive for back pain.   Psychiatric/Behavioral:  Negative for dysphoric mood. The patient is not nervous/anxious.  HISTORY:     History was reviewed and electronic chart updated:     Past Medical History:   Diagnosis Date    Anemia     Anxiety     Bursitis     Injury of posterior cruciate ligament     right knee    Ovarian cyst     Shoulder injury        Outpatient Medications Prior to Visit   Medication Sig Dispense Refill    ALPRAZolam (XANAX) 0.5 MG tablet Take 0.5-1 tablets (0.25-0.5 mg total) by mouth two (2) times a day as needed for anxiety. 10 tablet 0    clobetasoL (TEMOVATE) 0.05 % ointment Apply twice a day to stubborn eczema flares until the skin feels smooth, then discontinue. Do not apply to the skin on the face, underarms or groin. 60 g 1    cyclobenzaprine (FLEXERIL) 5 MG tablet Take 2 tablets (10 mg total) by mouth Three (3) times a day as needed for muscle spasms. 12 tablet 0    dupilumab 300 mg/2 mL PnIj Inject the contents of 1 pen (300 mg total) under the skin every fourteen (14) days. Maintenance dose. 4 mL 10    empty container Misc Use as directed to dispose of Dupixent pens. 1 each 2    gabapentin (NEURONTIN) 300 MG capsule TAKE 2-3 CAPSULES (600-900 MG TOTAL) BY MOUTH NIGHTLY. 270 capsule 3    levonorgestrel (MIRENA) 20 mcg/24 hr (5 years) IUD 1 kit by Intrauterine route once.      mupirocin (BACTROBAN) 2 % ointment Apply twice daily for areas with yellow crust and drainage. Can mix with the topical steroid. Use for 7 days, can restart if needed. 30 g 2    sertraline (ZOLOFT) 100 MG tablet Take 1 tablet (100 mg total) by mouth daily. 90 tablet 3    triamcinolone (KENALOG) 0.1 % ointment Apply twice a day to affected areas of the skin until the skin feels smooth, then discontinue. Do not apply to the skin on the face, underarms or groin. 80 g 0     No facility-administered medications prior to visit.        Allergies   Allergen Reactions    Latex Rash    Meloxicam Other (See Comments) and Nausea Only     GI distress    Morphine Itching    Tramadol Other (See Comments) and Nausea Only     GI distress       Social History     Socioeconomic History    Marital status: Married     Spouse name: Freida Busman    Number of children: 2   Tobacco Use    Smoking status: Former     Current packs/day: 0.00     Average packs/day: 1 pack/day for 15.0 years (15.0 ttl pk-yrs)     Types: Cigarettes     Start date: 06/28/1991     Quit date: 06/27/2006     Years since quitting: 16.3    Smokeless tobacco: Never   Vaping Use    Vaping status: Never Used   Substance and Sexual Activity    Alcohol use: Yes     Alcohol/week: 3.0 standard drinks of alcohol     Types: 3 Shots of liquor per week     Comment: pt average about 2-3 drinks/week. doing much better    Drug use: Yes     Types: Marijuana     Comment: daily    Sexual activity:  Yes     Partners: Male     Birth control/protection: I.U.D.   Other Topics Concern    Do you use sunscreen? Yes    Tanning bed use? No    Are you easily burned? Yes    Excessive sun exposure? No    Blistering sunburns? No   Social History Narrative    Lives with husband, son, and daughter In [place]    Self employed Environmental manager     Social Determinants of Health     Financial Resource Strain: Low Risk  (09/15/2021)    Overall Financial Resource Strain (CARDIA)     Difficulty of Paying Living Expenses: Not hard at all   Food Insecurity: No Food Insecurity (09/15/2021)    Hunger Vital Sign     Worried About Running Out of Food in the Last Year: Never true     Ran Out of Food in the Last Year: Never true   Transportation Needs: No Transportation Needs (09/15/2021)    PRAPARE - Therapist, art (Medical): No     Lack of Transportation (Non-Medical): No   Social Connections: Unknown (01/24/2018)    Social Connection and Isolation Panel [NHANES]     Marital Status: Married       Objective:     Visit Vitals  BP 116/75   Pulse 75   Temp 36 ??C (96.8 ??F) (Temporal)   Ht 160 cm (5' 2.99)   Wt 71.2 kg (157 lb)   SpO2 98%   BMI 27.82 kg/m??       Blood Pressure for the past 24 hrs:   BP   11/08/22 1010 116/75       There were no vitals filed for this visit.       PHYSICAL EXAM:    Physical Exam  Vitals and nursing note reviewed.   Constitutional:       General: She is not in acute distress.     Appearance: Normal appearance.   HENT:      Head: Normocephalic and atraumatic.   Cardiovascular:      Rate and Rhythm: Normal rate and regular rhythm.      Heart sounds: Normal heart sounds.   Pulmonary:      Effort: Pulmonary effort is normal. No respiratory distress.      Breath sounds: Normal breath sounds.   Musculoskeletal:      Lumbar back: Tenderness present. No bony tenderness. Decreased range of motion.      Comments: Patient has tenderness to left lumbar musculature and left oblique musculature.  No problems with flexion of lower back but extension is painful.  Patient also has pain and limited range of motion with sidebending.    No spinal tenderness over lumbar spine   Skin: General: Skin is warm and dry.   Neurological:      General: No focal deficit present.      Mental Status: She is alert.   Psychiatric:         Mood and Affect: Mood normal.         Behavior: Behavior normal.              Note - This record has been created using [employer]. Chart creation errors have been sought, but may not always have been located. Such creation errors do not reflect on the standard of medical care.

## 2022-11-30 NOTE — Unmapped (Signed)
[facility] Refill Coordination Note    Specialty Medication(s) to be Shipped:   Inflammatory Disorders: Dupixent    Other medication(s) to be shipped: No additional medications requested for fill at this time     Tracie Taylor, DOB: 02/19/1981  Phone: [phone] (home)       All above HIPAA information was verified with patient.     Was a Nurse, learning disability used for this call? No    Completed refill call assessment today to schedule patient's medication shipment from the [facility] [phone]).  All relevant notes have been reviewed.     Specialty medication(s) and dose(s) confirmed: Regimen is correct and unchanged.   Changes to medications: Lachlyn reports no changes at this time.  Changes to insurance: No  New side effects reported not previously addressed with a pharmacist or physician: None reported  Questions for the pharmacist: No    Confirmed patient received a Conservation officer, historic buildings and a Surveyor, mining with first shipment. The patient will receive a drug information handout for each medication shipped and additional FDA Medication Guides as required.       DISEASE/MEDICATION-SPECIFIC INFORMATION        For patients on injectable medications: Patient currently has 0 doses left.  Next injection is scheduled for 12/06/22.*Patient will be out of town until 6/17.    SPECIALTY MEDICATION ADHERENCE     Medication Adherence    Patient reported X missed doses in the last month: 0  Specialty Medication: dupilumab 300 mg/2 mL PnIj  Patient is on additional specialty medications: No  Informant: patient              Were doses missed due to medication being on hold? No    DUPIXENT PEN 300 mg/2 mL Pnij (dupilumab)  : 0 days of medicine on hand       REFERRAL TO PHARMACIST     Referral to the pharmacist: Not needed      [employer]     Shipping address confirmed in Epic.       Delivery Scheduled: Yes, Expected medication delivery date: 12/05/22.     Medication will be delivered via Same Day Courier to the prescription address in Epic WAM.    [name]   [facility] Specialty Technician

## 2022-12-05 MED FILL — DUPIXENT 300 MG/2 ML SUBCUTANEOUS PEN INJECTOR: SUBCUTANEOUS | 28 days supply | Qty: 4 | Fill #7

## 2022-12-30 NOTE — Unmapped (Signed)
[facility] Refill Coordination Note    Specialty Medication(s) to be Shipped:   Inflammatory Disorders: Dupixent    Other medication(s) to be shipped: No additional medications requested for fill at this time     Jakeline Stiegler, DOB: 11/05/1980  Phone: [phone] (home)       All above HIPAA information was verified with patient.     Was a Nurse, learning disability used for this call? No    Completed refill call assessment today to schedule patient's medication shipment from the [facility] [phone]).  All relevant notes have been reviewed.     Specialty medication(s) and dose(s) confirmed: Regimen is correct and unchanged.   Changes to medications: Janaeh reports no changes at this time.  Changes to insurance: No  New side effects reported not previously addressed with a pharmacist or physician: None reported  Questions for the pharmacist: No    Confirmed patient received a Conservation officer, historic buildings and a Surveyor, mining with first shipment. The patient will receive a drug information handout for each medication shipped and additional FDA Medication Guides as required.       DISEASE/MEDICATION-SPECIFIC INFORMATION        For patients on injectable medications: Patient currently has 0 doses left.  Next injection is scheduled for 01/03/23.    SPECIALTY MEDICATION ADHERENCE     Medication Adherence    Patient reported X missed doses in the last month: 0  Specialty Medication: DUPIXENT PEN 300 mg/2 mL Pnij (dupilumab)  Patient is on additional specialty medications: No  Patient is on more than two specialty medications: No  Any gaps in refill history greater than 2 weeks in the last 3 months: no  Demonstrates understanding of importance of adherence: yes              Were doses missed due to medication being on hold? No    DUPIXENT PEN 300   mg/ml: 0 days of medicine on hand       REFERRAL TO PHARMACIST     Referral to the pharmacist: Not needed      [facility]     Shipping address confirmed in Epic.       Delivery Scheduled: Yes, Expected medication delivery date: 01/02/23.     Medication will be delivered via Same Day Courier to the prescription address in Epic WAM.    Moshe Salisbury   [facility] Specialty Technician

## 2023-01-02 MED FILL — DUPIXENT 300 MG/2 ML SUBCUTANEOUS PEN INJECTOR: SUBCUTANEOUS | 28 days supply | Qty: 4 | Fill #8

## 2023-01-17 NOTE — Unmapped (Signed)
New Patient Clinic Note    Referring Physician :  Italy Steven McCain    History of Present Illness:    Tracie Taylor is a 42 y.o. female with history of DDD, anemia, anxiety, who presents for new patient evaluation of toe pain second toe right foot  patient reports that is been present for several months and feels like there is a cyst there and she considered putting a needle in the area to drainage. The patient did not however. She reports that is tender for out of 10 on the pain scale and worse in certain shoes when her toes rub together.        There were no vitals taken for this visit.      Allergies:  Allergies   Allergen Reactions    Latex Rash    Meloxicam Other (See Comments) and Nausea Only     GI distress    Morphine Itching    Tramadol Other (See Comments) and Nausea Only     GI distress       Medications:   Outpatient Encounter Medications as of 01/24/2023   Medication Sig Dispense Refill    ALPRAZolam (XANAX) 0.5 MG tablet Take 0.5-1 tablets (0.25-0.5 mg total) by mouth two (2) times a day as needed for anxiety. 10 tablet 0    clobetasoL (TEMOVATE) 0.05 % ointment Apply twice a day to stubborn eczema flares until the skin feels smooth, then discontinue. Do not apply to the skin on the face, underarms or groin. 60 g 1    cyclobenzaprine (FLEXERIL) 10 MG tablet Take 0.5-1 tablets (5-10 mg total) by mouth Three (3) times a day as needed for muscle spasms. 30 tablet 1    diclofenac (VOLTAREN) 75 MG EC tablet Take 1 tablet (75 mg total) by mouth two (2) times a day. 60 tablet 1    dupilumab 300 mg/2 mL PnIj Inject the contents of 1 pen (300 mg total) under the skin every fourteen (14) days. Maintenance dose. 4 mL 10    empty container Misc Use as directed to dispose of Dupixent pens. 1 each 2    gabapentin (NEURONTIN) 100 MG capsule Take 1-2 capsules (100-200 mg total) by mouth two (2) times a day as needed. 270 capsule 3    gabapentin (NEURONTIN) 300 MG capsule Take 2-3 capsules (600-900 mg total) by mouth nightly. 270 capsule 3    levonorgestrel (MIRENA) 20 mcg/24 hr (5 years) IUD 1 kit by Intrauterine route once.      mupirocin (BACTROBAN) 2 % ointment Apply twice daily for areas with yellow crust and drainage. Can mix with the topical steroid. Use for 7 days, can restart if needed. 30 g 2    sertraline (ZOLOFT) 100 MG tablet Take 1 tablet (100 mg total) by mouth daily. 90 tablet 2    triamcinolone (KENALOG) 0.1 % ointment Apply twice a day to affected areas of the skin until the skin feels smooth, then discontinue. Do not apply to the skin on the face, underarms or groin. 80 g 0     No facility-administered encounter medications on file as of 01/24/2023.       Medical History:  Past Medical History:   Diagnosis Date    Anemia     Anxiety     Bursitis     Injury of posterior cruciate ligament     right knee    Ovarian cyst     Shoulder injury  Surgical History:  Past Surgical History:   Procedure Laterality Date    BUNIONECTOMY      CESAREAN SECTION      X2    PR CORRJ HLX VLGS BNCTY SESMDC DSTL METAR OSTEOT Right 05/22/2019    Procedure: CORRECTION, HALLUX VALGUS (BUNIONECTOMY), WITH SESAMOIDECTOMY, WHEN PERFORMED; WITH DISTAL METATARSAL OSTEOTOMY, ANY METHOD;  Surgeon: Jearl Klinefelter, DPM;  [facility];  Service: Vascular    PR INSERT INTRAUTERINE DEVICE N/A 02/28/2019    Procedure: INSERTION OF INTRAUTERINE DEVICE (IUD) Mirena;  Surgeon: Tamsen Roers, MD PhD;  Location: [facility];  Service: [facility] Primary Gynecology    PR REMOVE INTRAUTERINE DEVICE N/A 02/28/2019    Procedure: REMOVAL OF INTRAUTERINE DEVICE (IUD);  Surgeon: Tamsen Roers, MD PhD;  Location: [facility] OR [facility];  Service: [facility] Primary Gynecology    PR UPPER GI ENDOSCOPY,BIOPSY N/A 01/24/2018    Procedure: UGI ENDOSCOPY; WITH BIOPSY, SINGLE OR MULTIPLE;  Surgeon: Alfred Levins, MD;  Location: [facility] PROCED[facility];  Service: Gastroenterology    SKIN BIOPSY      SPLENECTOMY N/A 1988?       Social History:  Social History     Socioeconomic History    Marital status: Married     Spouse name: Freida Busman    Number of children: 2   Tobacco Use    Smoking status: Former     Current packs/day: 0.00     Average packs/day: 1 pack/day for 15.0 years (15.0 ttl pk-yrs)     Types: Cigarettes     Start date: 06/28/1991     Quit date: 06/27/2006     Years since quitting: 16.5    Smokeless tobacco: Never   Vaping Use    Vaping status: Never Used   Substance and Sexual Activity    Alcohol use: Yes     Alcohol/week: 3.0 standard drinks of alcohol     Types: 3 Shots of liquor per week     Comment: pt average about 2-3 drinks/week. doing much better    Drug use: Yes     Types: Marijuana     Comment: daily    Sexual activity: Yes     Partners: Male     Birth control/protection: I.U.D.   Other Topics Concern    Do you use sunscreen? Yes    Tanning bed use? No    Are you easily burned? Yes    Excessive sun exposure? No    Blistering sunburns? No   Social History Narrative    Lives with husband, son, and daughter In [place]    Self employed Environmental manager     Social Determinants of Health     Financial Resource Strain: Low Risk  (09/15/2021)    Overall Financial Resource Strain (CARDIA)     Difficulty of Paying Living Expenses: Not hard at all   Food Insecurity: No Food Insecurity (09/15/2021)    Hunger Vital Sign     Worried About Running Out of Food in the Last Year: Never true     Ran Out of Food in the Last Year: Never true   Transportation Needs: No Transportation Needs (09/15/2021)    PRAPARE - Therapist, art (Medical): No     Lack of Transportation (Non-Medical): No   Social Connections: Unknown (01/24/2018)    Social Connection and Isolation Panel [NHANES]     Marital Status: Married  Family History:  Family History   Problem Relation Age of Onset    Cancer Mother         bone, lung, liver    Anesthesia problems Mother     Seizures Mother     Bipolar disorder Father     Broken bones Father     No Known Problems Daughter     No Known Problems Son     No Known Problems Maternal Aunt     No Known Problems Maternal Uncle     No Known Problems Paternal Aunt     No Known Problems Paternal Uncle     No Known Problems Maternal Grandmother     No Known Problems Maternal Grandfather     No Known Problems Paternal Grandmother     No Known Problems Paternal Grandfather     No Known Problems Other     Colorectal Cancer Neg Hx     Esophageal cancer Neg Hx     Liver cancer Neg Hx     Pancreatic cancer Neg Hx     Stomach cancer Neg Hx     Clotting disorder Neg Hx     Collagen disease Neg Hx     Diabetes Neg Hx     Dislocations Neg Hx     Fibromyalgia Neg Hx     Gout Neg Hx     Hemophilia Neg Hx     Osteoporosis Neg Hx     Rheumatologic disease Neg Hx     Scoliosis Neg Hx     Severe sprains Neg Hx     Sickle cell anemia Neg Hx     Spinal Compression Fracture Neg Hx     Breast cancer Neg Hx            Physical Exam:   Consitutional: No acute distress, alert and oriented.    Vascular:   Palpable pulses of the DP and PT artery bilateral feet  Temperature gradient within normal limits  Capillary refill brisk x 10  No varicosities noted bilaterally    Neurologic:    Right Left   10 gram filament [x]  Present  []  Not present  []  Intermittent [x]  Present  []  Not present  []  Intermittent   Positional sense great toe joint []  Present  []  Not present []  Present  []  Not present   128 cps vibratory sense []  Diminished  []  Intact []  Diminished  []  Intact   Deep tendon reflexes 2/5 []  2/5 normal  []  Abnormal []  2/5 normal  []  Abnormal   Babinski sign []  Present  []  Not present []  Present  []  Not present        Orthopedic:   average arch morphology   No evidence of Charcot  historical bunion surgery in good alignment. Patient has a changes on second toe PIP J with freely movable soft tissue mass that has slight sponging is consistent with fluid filled sac.  Patient has deviation of the digit at the DI PJ and nodular area that could be consistent with arthritic nodule. Similar small nodules are noted at the DI PJ on the digits of her hand consistent with arthritis s nodules  Dermatologic:   No callus development   No interdigital fissures or maceration bilateral feet       No osteoblast lesions on x-ray and full report is pending    Assessment/Recommendations:  findings are consistent most likely with bursitis but differential also includes mucoid cyst and ganglion cyst second toe  right foot    Plan:   x-ray done today and patient is elected to return back for possible drainage of cyst attempt in a few days.  This may be followed by use of mild steroid if there is cystic fluid and compression. We of discussed that if this is a system we drainage and has potential to recur.      PROCEDURE:

## 2023-01-24 ENCOUNTER — Ambulatory Visit
Admit: 2023-01-24 | Discharge: 2023-01-25 | Payer: PRIVATE HEALTH INSURANCE | Attending: Podiatrist | Primary: Podiatrist

## 2023-01-24 ENCOUNTER — Ambulatory Visit: Admit: 2023-01-24 | Discharge: 2023-01-25 | Payer: PRIVATE HEALTH INSURANCE

## 2023-01-24 DIAGNOSIS — M67479 Ganglion, unspecified ankle and foot: Principal | ICD-10-CM

## 2023-01-24 DIAGNOSIS — M21619 Bunion of unspecified foot: Principal | ICD-10-CM

## 2023-01-24 NOTE — Unmapped (Signed)
[facility] Refill Coordination Note    Specialty Medication(s) to be Shipped:   Inflammatory Disorders: Dupixent    Other medication(s) to be shipped: No additional medications requested for fill at this time     Tracie Taylor, DOB: 02/07/1981  Phone: [phone] (home)       All above HIPAA information was verified with patient.     Was a Nurse, learning disability used for this call? No    Completed refill call assessment today to schedule patient's medication shipment from the [facility] [phone]).  All relevant notes have been reviewed.     Specialty medication(s) and dose(s) confirmed: Regimen is correct and unchanged.   Changes to medications: Tracie Taylor reports no changes at this time.  Changes to insurance: No  New side effects reported not previously addressed with a pharmacist or physician: None reported  Questions for the pharmacist: No    Confirmed patient received a Conservation officer, historic buildings and a Surveyor, mining with first shipment. The patient will receive a drug information handout for each medication shipped and additional FDA Medication Guides as required.       DISEASE/MEDICATION-SPECIFIC INFORMATION        For patients on injectable medications: Patient currently has 0 doses left.  Next injection is scheduled for 01/31/23.    SPECIALTY MEDICATION ADHERENCE     Medication Adherence    Patient reported X missed doses in the last month: 0  Specialty Medication: dupilumab 300 mg/2 mL PnIj  Patient is on additional specialty medications: No  Informant: patient              Were doses missed due to medication being on hold? No    DUPIXENT PEN 300   mg/ml: 0 days of medicine on hand       REFERRAL TO PHARMACIST     Referral to the pharmacist: Not needed      [facility]     Shipping address confirmed in Epic.       Delivery Scheduled: Yes, Expected medication delivery date: 01/27/23.     Medication will be delivered via Same Day Courier to the prescription address in Epic WAM.    Tracie Taylor   [facility] Specialty Technician

## 2023-01-24 NOTE — Unmapped (Signed)
Today Your Doctor Recommends:    X-rays ordered today      Should you have additional questions or concerns, please do not hesitate to call the clinic or send a message on MyChart for further instruction. If you develop any signs of infection, such as increased redness, swelling, pain, loss of appetite, fever, nausea, vomiting, chills, shortness of breath, or chest pain, you should call the clinic and present to ER immediately.        During your visit today the Doctor has ordered for you:  Xrays of right foot     You can get the X-rays at any [facility] Radiology Department.   Here is the closest one to this Clinic:    [facility]  Address: [address] [place], [place] [address]

## 2023-01-27 MED FILL — DUPIXENT 300 MG/2 ML SUBCUTANEOUS PEN INJECTOR: SUBCUTANEOUS | 28 days supply | Qty: 4 | Fill #9

## 2023-02-02 ENCOUNTER — Ambulatory Visit
Admit: 2023-02-02 | Discharge: 2023-02-03 | Payer: PRIVATE HEALTH INSURANCE | Attending: Podiatrist | Primary: Podiatrist

## 2023-02-02 NOTE — Unmapped (Signed)
HISTORY OF PRESENT ILLNESS: Tracie Taylor is a 42 y.o. female with history of DDD, anemia, anxiety, who returns to clinic for follow up visit. The patient was last seen in clinic on 01/24/23, at which time XR of right foot was ordered. We discussed plan for patient to return for possible drainage of cyst following XR. We also discussed we may consider mild steroid if there is cystic fluid.     Today, patient reports she is doing well. Patient is still interested in having the cyst drained. Denies any other concerns.       ROS: See HPI, otherwise 10 systems reviewed is negative.      RX/ ALLERGIES/ MED HX/SURG HX/ SOC HX/FAM HX: Reviewed & updated in Epic      PHYSICAL EXAM:   Consitutional: No acute distress, alert and oriented.    Neurologic  Unchanged.    Vascular:   Unchanged.    Orthopedic:   Patient has a changes on second toe PIP J with freely movable soft tissue mass that has slight sponging is consistent with fluid filled sac.  Patient has deviation of the digit at the DI PJ and nodular area that could be consistent with arthritic nodule. Similar small nodules are noted at the DI PJ on the digits of her hand consistent with arthritis nodules         Dermatologic:   Unchanged.     PROCEDURE:  20612 - Aspiration of ganglion cyst  right 2nd toe    Informed consent was obtained. Area was prepped with alcohol and iodine. 4 mL of lidocaine injected proximal to the cyst for regional block. Once anesthesia was achieved, additional skin preparation was done with iodine followed by aspiration of  0.3 ml of gelatinous fluid. Fluid was discarded as we were unable to obtain significant enough material for further lab evaluation and fluid was not worrisome. Compression placed following aspiration. Patient tolerated procedure well.       LABS/IMAGING:   XR of right foot (01/24/23) showed:   Radiographically healed first metatarsal osteotomy with screw fixation.   Medial first metatarsal head osteectomy. Assessment and Plan:     Ganglion or mucoid cyst second toe right foot - We reviewed XR results which were normal. We discussed that we are able to proceed with aspiration of the cyst of  the right second toe. We may also consider a mild steroid injection after to further treat the area. Patient opted to have the area drained. Consent obtained. Fluid was discarded as we were unable to obtain significant enough material for further lab evaluation and fluid was not worrisome. We may consider sending fluid to the lab, if the cysts recurs. She may take motrin or tylenol for pain management. Recommend taking it easy for the next couple days.     RTC PRN    ______________________________________________________________________    Documentation assistance was provided by Kearney Hard, Scribe, on February 02, 2023 at 3:07 PM for London Sheer, DPM.    ----------------------------------------------------------------------------------------------------------------------  February 03, 2023 11:13 AM. Documentation assistance provided by the Scribe. I was present during the time the encounter was recorded. The information recorded by the Scribe was done at my direction and has been reviewed and validated by me.  ----------------------------------------------------------------------------------------------------------------------

## 2023-02-16 NOTE — Unmapped (Signed)
[facility] Special[facility] Pharm[facility] Refill Coordination Note    Specialty Medication(s) to be Shipped:   Inflammatory Disorders: Dupixent    Other medication(s) to be shipped: No additional medications requested for fill at this time     Tracie Taylor, DOB: 09-09-1980  Phone: [phone] (home)       All above HIPAA information was verified with patient.     Was a Nurse, learning disability used for this call? No    Completed refill call assessment today to schedule patient's medication shipment from the [facility] [phone]).  All relevant notes have been reviewed.     Specialty medication(s) and dose(s) confirmed: Regimen is correct and unchanged.   Changes to medications: Addaline reports no changes at this time.  Changes to insurance: No  New side effects reported not previously addressed with a pharmacist or physician: None reported  Questions for the pharmacist: No    Confirmed patient received a Conservation officer, historic buildings and a Surveyor, mining with first shipment. The patient will receive a drug information handout for each medication shipped and additional FDA Medication Guides as required.       DISEASE/MEDICATION-SPECIFIC INFORMATION        For patients on injectable medications: Patient currently has 0 doses left.  Next injection is scheduled for 02/28/23.    SPECIALTY MEDICATION ADHERENCE     Medication Adherence    Patient reported X missed doses in the last month: 0  Specialty Medication: dupilumab 300 mg/2 mL PnIj  Patient is on additional specialty medications: No  Informant: patient              Were doses missed due to medication being on hold? No    DUPIXENT PEN 300   mg/ml: 0 days of medicine on hand       REFERRAL TO PHARMACIST     Referral to the pharmacist: Not needed      [facility]     Shipping address confirmed in Epic.       Delivery Scheduled: Yes, Expected medication delivery date: 02/24/23.     Medication will be delivered via Same Day Courier to the prescription address in Epic WAM.    [name]   [facility] Specialty Technician

## 2023-02-24 MED FILL — DUPIXENT 300 MG/2 ML SUBCUTANEOUS PEN INJECTOR: SUBCUTANEOUS | 28 days supply | Qty: 4 | Fill #10

## 2023-03-02 DIAGNOSIS — F411 Generalized anxiety disorder: Principal | ICD-10-CM

## 2023-03-02 MED ORDER — ALPRAZOLAM 0.5 MG TABLET
ORAL_TABLET | Freq: Two times a day (BID) | ORAL | 0 refills | 5 days | PRN
Start: 2023-03-02 — End: 2024-03-01

## 2023-03-02 NOTE — Unmapped (Signed)
Patient is requesting the following refill  Requested Prescriptions     Pending Prescriptions Disp Refills    ALPRAZolam (XANAX) 0.5 MG tablet 10 tablet 0     Sig: Take 0.5-1 tablets (0.25-0.5 mg total) by mouth two (2) times a day as needed for anxiety.       Last refill given on: 11/08/2022 with 10 count and 0 refills.     Recent Visits  Date Type Provider Dept   11/08/22 Office Visit McCain, [place] Steven, DO [facility] [address]   06/06/22 Office Visit McCain, [place] Steven, [place] [facility] [address]   Showing recent visits within past 365 days and meeting all other requirements  Future Appointments  No visits were found meeting these conditions.  Showing future appointments within next 365 days and meeting all other requirements       Opioid Monitoring   Urine Tox Screen Last Drug Screen Date: Not Found  Opiate Confirmation Test Last Drug Screen Date: Not Found  Last Opioid Dispensed Provider: Not Found  Prescribed MEDD : 0  Last PDMP Review: 11/08/2022 10:32 AM  Last Opioid Pain Agreement Signed Date: Not Found  Last Non Opioid Controlled Substance Pain Agreement Signed Date: Not Found    Naloxone Ordered: Not Found  Last OV: 11/08/2022

## 2023-03-03 MED ORDER — ALPRAZOLAM 0.5 MG TABLET
ORAL_TABLET | Freq: Two times a day (BID) | ORAL | 0 refills | 5 days | Status: CP | PRN
Start: 2023-03-03 — End: 2023-08-30

## 2023-03-03 NOTE — Unmapped (Signed)
Chart reviewed.  Prescription  sent.    I have reviewed this patients PDMP report. There are no concerning findings on this patients report at this time.      Requested Prescriptions     Signed Prescriptions Disp Refills    ALPRAZolam (XANAX) 0.5 MG tablet 10 tablet 0     Sig: Take 0.5-1 tablets (0.25-0.5 mg total) by mouth two (2) times a day as needed for anxiety.     Authorizing Provider: Prisca Gearing, [place] STEVEN       Follow-Up:  Visit date not found     -CM

## 2023-03-18 DIAGNOSIS — L2084 Intrinsic (allergic) eczema: Principal | ICD-10-CM

## 2023-03-18 MED ORDER — DUPIXENT 300 MG/2 ML SUBCUTANEOUS PEN INJECTOR
SUBCUTANEOUS | 10 refills | 28 days
Start: 2023-03-18 — End: ?

## 2023-03-18 NOTE — Unmapped (Signed)
[facility] Refill Coordination Note    Specialty Medication(s) to be Shipped:   Inflammatory Disorders: Dupixent    Other medication(s) to be shipped: No additional medications requested for fill at this time     Tracie Taylor, DOB: [birth year]  Phone: [phone] (home)       All above HIPAA information was verified with patient.     Was a Nurse, learning disability used for this call? No    Completed refill call assessment today to schedule patient's medication shipment from the [facility] [phone]).  All relevant notes have been reviewed.     Specialty medication(s) and dose(s) confirmed: Regimen is correct and unchanged.   Changes to medications: Tracie Taylor reports no changes at this time.  Changes to insurance: No  New side effects reported not previously addressed with a pharmacist or physician: None reported  Questions for the pharmacist: No    Confirmed patient received a Conservation officer, historic buildings and a Surveyor, mining with first shipment. The patient will receive a drug information handout for each medication shipped and additional FDA Medication Guides as required.       DISEASE/MEDICATION-SPECIFIC INFORMATION        For patients on injectable medications: Patient currently has 0 doses left.  Next injection is scheduled for 03/28/23.    SPECIALTY MEDICATION ADHERENCE     Medication Adherence    Patient reported X missed doses in the last month: 0  Specialty Medication: dupilumab 300 mg/2 mL PnIj  Patient is on additional specialty medications: No  Informant: patient              Were doses missed due to medication being on hold? No    DUPIXENT PEN 300   mg/ml: 0 days of medicine on hand       REFERRAL TO PHARMACIST     Referral to the pharmacist: Not needed      [facility]     Shipping address confirmed in Epic.       Delivery Scheduled: Yes, Expected medication delivery date: 10/3.  However, Rx request for refills was sent to the provider as there are none remaining. Medication will be delivered via Same Day Courier to the prescription address in Epic WAM.    Tracie Taylor   [facility] Specialty Technician

## 2023-03-20 MED ORDER — DUPIXENT 300 MG/2 ML SUBCUTANEOUS PEN INJECTOR
SUBCUTANEOUS | 10 refills | 28.00000 days
Start: 2023-03-20 — End: ?

## 2023-03-20 NOTE — Unmapped (Signed)
Appointment on 10/01 at 4:00 pm with Dr. Montine Circle

## 2023-03-20 NOTE — Unmapped (Signed)
Refill request for dupilumab 300 mg.    LOV: 04/04/2022    Medication pended if appropriate.

## 2023-03-20 NOTE — Unmapped (Signed)
Refill not appropriate, needs appointment for further refills. Patient is scheduled for visit on 03/21/23.

## 2023-03-21 ENCOUNTER — Ambulatory Visit: Admit: 2023-03-21 | Discharge: 2023-03-22 | Payer: PRIVATE HEALTH INSURANCE

## 2023-03-21 DIAGNOSIS — L2084 Intrinsic (allergic) eczema: Principal | ICD-10-CM

## 2023-03-21 MED ORDER — CLOBETASOL 0.05 % TOPICAL CREAM
OPHTHALMIC | 5 refills | 0.00000 days | Status: CP
Start: 2023-03-21 — End: ?

## 2023-03-21 MED ORDER — DUPILUMAB 300 MG/2 ML SUBCUTANEOUS PEN INJECTOR
SUBCUTANEOUS | 10 refills | 28.00000 days | Status: CP
Start: 2023-03-21 — End: ?
  Filled 2023-03-23: qty 4, 28d supply, fill #0

## 2023-03-21 NOTE — Unmapped (Signed)
Dermatology Note     Assessment and Plan:      Atopic dermatitis, BSA 2%, significant improvement dupilumab:  chronic, improved but not at treatment goal  - Previously tried triamcinolone, clobetasol, tacrolimus, systemic corticosteroids  - Diagnosis, treatment options, prognosis, risk/ benefit, and side effects of treatment were discussed with the patient.   - Continue Dupixent 300mg  every 14-days  - Start clobetasol cream bid for any active areas  - Reviewed proper use of topical steroids to minimize the risk of steroid-induced skin changes.   - Also discussed appropriate dry skin care including daily warm baths with gentle soap, followed by liberal bland moisturizer application.   - A patient education handout reiterating this information was provided.    The patient was advised to call for an appointment should any new, changing, or symptomatic lesions develop.     RTC: Return in about 6 months (around 09/19/2023) for recheck in [facility] continuity clinic with [facility]. or sooner as needed   _________________________________________________________________  Chief Complaint     Chief Complaint   Patient presents with    Eczema     Eczema and med refill , no concerns      HPI     Tracie Taylor is a 42 y.o. female who presents as a returning patient (last seen 04/04/2022) to Dermatology for follow up of eczematous dermatitis. At last visit, started Dupixent with plan to be on maintenance dosing of 300mg  every 2-weeks. Continued topical corticosteroids. Completed a 2-week perdnisone taper.     The patient denies any other new or changing lesions or areas of concern.     Pertinent Past Medical History     No history of skin cancer    Problem List       Eczema    Relevant Medications    dupilumab 300 mg/2 mL PnIj    clobetasol (TEMOVATE) 0.05 % cream     Past Medical History, Family History, Social History, Medication List, Allergies, and Problem List were reviewed in the rooming section of Epic.     ROS: Other than symptoms mentioned in the HPI, no fevers, chills, or other skin complaints    Physical Examination     GENERAL: Well-appearing female in no acute distress, resting comfortably.  NEURO: Alert and oriented, answers questions appropriately  SKIN (Sun exposed Exam): Per patient request, examination of the face, neck, scalp, bilateral upper extremities, palms, and fingernails was performed  - Hyperpigmented scaly plaque to left lower extremity, much improved from before    All areas not commented on are within normal limits or unremarkable    (Approved Template 03/02/2020)

## 2023-03-21 NOTE — Unmapped (Addendum)
Plan Summary:    -continue dupixent        Thank you for choosing [facility].  If you have any other questions or concerns, please contact us using [facility] or call [phone].      If you would like to get into contact with your dermatologist, please call our office at [phone] or, for non-urgent questions, send a MyChart message to the attending physician for the visit. Any message sent to the visit's attending physician on MyChart will be routed to the resident physician who participated in your care. Please allow 3 business days for response via MyChart.

## 2023-04-12 NOTE — Unmapped (Signed)
[facility] Pharm[facility] Refill Coordination Note    Specialty Medication(s) to be Shipped:   Inflammatory Disorders: Dupixent    Other medication(s) to be shipped: No additional medications requested for fill at this time     Tracie Taylor, DOB: May 24, 1981  Phone: [phone] (home)       All above HIPAA information was verified with patient.     Was a Nurse, learning disability used for this call? No    Completed refill call assessment today to schedule patient's medication shipment from the [facility] [phone]).  All relevant notes have been reviewed.     Specialty medication(s) and dose(s) confirmed: Regimen is correct and unchanged.   Changes to medications: [name] reports no changes at this time.  Changes to insurance: No  New side effects reported not previously addressed with a pharmacist or physician: None reported  Questions for the pharmacist: No    Confirmed patient received a Conservation officer, historic buildings and a Surveyor, mining with first shipment. The patient will receive a drug information handout for each medication shipped and additional FDA Medication Guides as required.       DISEASE/MEDICATION-SPECIFIC INFORMATION        For patients on injectable medications: Patient currently has 0 doses left.  Next injection is scheduled for 04/25/23.    SPECIALTY MEDICATION ADHERENCE     Medication Adherence    Patient reported X missed doses in the last month: 0  Specialty Medication: dupilumab 300 mg/2 mL PnIj  Patient is on additional specialty medications: No  Informant: patient              Were doses missed due to medication being on hold? No    DUPIXENT PEN 300   mg/ml: 0 days of medicine on hand       REFERRAL TO PHARMACIST     Referral to the pharmacist: Not needed      [facility]     Shipping address confirmed in Epic.       Delivery Scheduled: Yes, Expected medication delivery date: 10/31.     Medication will be delivered via Same Day Courier to the prescription address in Epic WAM.    [name]   [facility] Specialty Technician

## 2023-04-20 MED FILL — DUPIXENT 300 MG/2 ML SUBCUTANEOUS PEN INJECTOR: SUBCUTANEOUS | 28 days supply | Qty: 4 | Fill #1

## 2023-05-15 NOTE — Unmapped (Signed)
[facility] Pharm[facility] Refill Coordination Note    Specialty Medication(s) to be Shipped:   Inflammatory Disorders: Dupixent    Other medication(s) to be shipped: No additional medications requested for fill at this time     Tracie Taylor, DOB: [birth year]  Phone: [phone] (home)       All above HIPAA information was verified with patient.     Was a Nurse, learning disability used for this call? No    Completed refill call assessment today to schedule patient's medication shipment from the [facility]  [phone]).  All relevant notes have been reviewed.     Specialty medication(s) and dose(s) confirmed: Regimen is correct and unchanged.   Changes to medications: Tracie Taylor reports no changes at this time.  Changes to insurance: No  New side effects reported not previously addressed with a pharmacist or physician: None reported  Questions for the pharmacist: No    Confirmed patient received a Conservation officer, historic buildings and a Surveyor, mining with first shipment. The patient will receive a drug information handout for each medication shipped and additional FDA Medication Guides as required.       DISEASE/MEDICATION-SPECIFIC INFORMATION        For patients on injectable medications: Patient currently has 0 doses left.  Next injection is scheduled for 05/16/23.    SPECIALTY MEDICATION ADHERENCE     Medication Adherence    Patient reported X missed doses in the last month: 0  Specialty Medication: dupilumab (Dupixent) 300 mg/2 mL PnIj  Patient is on additional specialty medications: No              Were doses missed due to medication being on hold? No    Dupixent 300/2 mg/ml: 0 doses of medicine on hand       REFERRAL TO PHARMACIST     Referral to the pharmacist: Not needed      [facility]     Shipping address confirmed in Epic.       Delivery Scheduled: Yes, Expected medication delivery date: 05/16/23.     Medication will be delivered via Same Day Courier to the prescription address in Epic WAM.    Tracie Taylor   [facility]  Specialty Technician

## 2023-05-16 MED FILL — DUPIXENT 300 MG/2 ML SUBCUTANEOUS PEN INJECTOR: SUBCUTANEOUS | 28 days supply | Qty: 4 | Fill #2

## 2023-06-05 DIAGNOSIS — L2084 Intrinsic (allergic) eczema: Principal | ICD-10-CM

## 2023-06-08 NOTE — Unmapped (Signed)
Tracie Taylor continues to do well on Dupixent - she noticed a slight return of eczema around the 14 day mark when it's time to redose but otherwise no recent flaring. She occasionally uses topical steroids if needed.     [facility] Clinical Assessment & Refill Coordination Note    Hadil Demirjian, [place]: 06-Jun-1981  Phone: [phone] (home)     All above HIPAA information was verified with patient.     Was a Nurse, learning disability used for this call? No    Specialty Medication(s):   Inflammatory Disorders: Dupixent     Current Outpatient Medications   Medication Sig Dispense Refill    ALPRAZolam (XANAX) 0.5 MG tablet Take 0.5-1 tablets (0.25-0.5 mg total) by mouth two (2) times a day as needed for anxiety. 10 tablet 0    clobetasol (TEMOVATE) 0.05 % cream Apply the medication twice daily to affected areas of the skin until smooth. Then stop and re-start as soon as the skin changes come back. 60 g 5    cyclobenzaprine (FLEXERIL) 10 MG tablet Take 0.5-1 tablets (5-10 mg total) by mouth Three (3) times a day as needed for muscle spasms. 30 tablet 1    diclofenac (VOLTAREN) 75 MG EC tablet Take 1 tablet (75 mg total) by mouth two (2) times a day. 60 tablet 1    dupilumab 300 mg/2 mL PnIj Inject the contents of 1 pen (300 mg total) under the skin every fourteen (14) days. Maintenance dose. 4 mL 10    empty container Misc Use as directed to dispose of Dupixent pens. 1 each 2    gabapentin (NEURONTIN) 100 MG capsule Take 1-2 capsules (100-200 mg total) by mouth two (2) times a day as needed. 270 capsule 3    gabapentin (NEURONTIN) 300 MG capsule Take 2-3 capsules (600-900 mg total) by mouth nightly. 270 capsule 3    levonorgestrel (MIRENA) 20 mcg/24 hr (5 years) IUD 1 each by Intrauterine route once.      mupirocin (BACTROBAN) 2 % ointment Apply twice daily for areas with yellow crust and drainage. Can mix with the topical steroid. Use for 7 days, can restart if needed. 30 g 2    sertraline (ZOLOFT) 100 MG tablet Take 1 tablet (100 mg total) by mouth daily. 90 tablet 2     No current facility-administered medications for this visit.        Changes to medications: Shatasia reports no changes at this time.    Allergies   Allergen Reactions    Latex Rash    Meloxicam Other (See Comments) and Nausea Only     GI distress    Morphine Itching and Nausea Only    Tramadol Other (See Comments) and Nausea Only     GI distress       Changes to allergies: No    SPECIALTY MEDICATION ADHERENCE     Dupixent - 0 left  Medication Adherence    Patient reported X missed doses in the last month: 0  Specialty Medication: Dupixent          Specialty medication(s) dose(s) confirmed: Regimen is correct and unchanged.     Are there any concerns with adherence? No    Adherence counseling provided? Not needed    CLINICAL MANAGEMENT AND INTERVENTION      Clinical Benefit Assessment:    Do you feel the medicine is effective or helping your condition? Yes    Clinical Benefit counseling provided? Not needed  Adverse Effects Assessment:    Are you experiencing any side effects? No    Are you experiencing difficulty administering your medicine? No    Quality of Life Assessment:    Quality of Life    Rheumatology  Oncology  Dermatology  1. What impact has your specialty medication had on the symptoms of your skin condition (i.e. itchiness, soreness, stinging)?: Tremendous  2. What impact has your specialty medication had on your comfort level with your skin?: Tremendous  Cystic Fibrosis          How many days over the past month did your AD  keep you from your normal activities? For example, brushing your teeth or getting up in the morning. 0    Have you discussed this with your provider? Not needed    Acute Infection Status:    Acute infections noted within Epic:  No active infections  Patient reported infection: None    Therapy Appropriateness:    Is therapy appropriate based on current medication list, adverse reactions, adherence, clinical benefit and progress toward achieving therapeutic goals? Yes, therapy is appropriate and should be continued     DISEASE/MEDICATION-SPECIFIC INFORMATION      For patients on injectable medications: Patient currently has 0 doses left.  Next injection is scheduled for Tues, 12/24.    Chronic Inflammatory Diseases: Have you experienced any flares in the last month? No  Has this been reported to your provider? No    PATIENT SPECIFIC NEEDS     Does the patient have any physical, cognitive, or cultural barriers? No    Is the patient high risk? No    Did the patient require a clinical intervention? No    Does the patient require physician intervention or other additional services (i.e., nutrition, smoking cessation, social work)? No    SOCIAL DETERMINANTS OF HEALTH     At the [facility], we have learned that life circumstances - like trouble affording food, housing, utilities, or transportation can affect the health of many of our patients.   That is why we wanted to ask: are you currently experiencing any life circumstances that are negatively impacting your health and/or quality of life? Patient declined to answer    Social Drivers of Health     Food Insecurity: No Food Insecurity (09/15/2021)    Hunger Vital Sign     Worried About Running Out of Food in the Last Year: Never true     Ran Out of Food in the Last Year: Never true   Internet Connectivity: Not on file   Housing/Utilities: Unknown (09/17/2022)    Housing/Utilities     Within the past 12 months, have you ever stayed: outside, in a car, in a tent, in an overnight shelter, or temporarily in someone else's home (i.e. 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)  Alcohol Use     How often do you have a drink containing alcohol?: 4+ times per week     How many drinks containing alcohol do you have on a typical day when you are drinking?: 5 - 6     How often do you have 5 or more drinks on one occasion?: Daily or almost daily   Interpersonal Safety: Not on file   Physical Activity: Not on file   Intimate Partner Violence: Not At Risk (01/24/2018)    Humiliation, Afraid, Rape, and Kick questionnaire     Fear of Current or Ex-Partner: No     Emotionally Abused: No     Physically Abused: No     Sexually Abused: No   Stress: Not on file   Substance Use: Not on file (04/26/2023)   Social Connections: Unknown (01/24/2018)    Social Connection and Isolation Panel [NHANES]     Frequency of Communication with Friends and Family: Not on file     Frequency of Social Gatherings with Friends and Family: Not on file     Attends Religious Services: Not on file     Active Member of Clubs or Organizations: Not on file     Attends Banker Meetings: Not on file     Marital Status: Married   Physicist, medical Strain: Low Risk  (09/15/2021)    Overall Financial Resource Strain (CARDIA)     Difficulty of Paying Living Expenses: Not hard at all   Depression: At risk (08/06/2020)    PHQ-2     PHQ-2 Score: 6   Health Literacy: Low Risk  (09/11/2021)    Health Literacy     : Never       Would you be willing to receive help with any of the needs that you have identified today? Not applicable       SHIPPING     Specialty Medication(s) to be Shipped:   Inflammatory Disorders: Dupixent    Other medication(s) to be shipped: No additional medications requested for fill at this time     Changes to insurance: No    Delivery Scheduled: Yes, Expected medication delivery date: 12/23.     Medication will be delivered via Same Day Courier to the confirmed prescription address in [facility].    The patient will receive a drug information handout for each medication shipped and additional FDA Medication Guides as required.  Verified that patient has previously received a Conservation officer, historic buildings and a Surveyor, mining.    The patient or caregiver noted above participated in the development of this care plan and knows that they can request review of or adjustments to the care plan at any time.      All of the patient's questions and concerns have been addressed.    Laquisha Northcraft [facility] Specialty Pharmacist

## 2023-06-12 MED FILL — DUPIXENT 300 MG/2 ML SUBCUTANEOUS PEN INJECTOR: SUBCUTANEOUS | 28 days supply | Qty: 4 | Fill #3

## 2023-06-24 DIAGNOSIS — F411 Generalized anxiety disorder: Principal | ICD-10-CM

## 2023-06-24 MED ORDER — ALPRAZOLAM 0.5 MG TABLET
ORAL_TABLET | Freq: Two times a day (BID) | ORAL | 0 refills | 0.00 days | PRN
Start: 2023-06-24 — End: ?

## 2023-06-26 MED ORDER — ALPRAZOLAM 0.5 MG TABLET
ORAL_TABLET | Freq: Two times a day (BID) | ORAL | 0 refills | 5.00 days | Status: CP | PRN
Start: 2023-06-26 — End: 2023-09-24

## 2023-06-26 NOTE — Unmapped (Signed)
 Chart reviewed.  Prescription  sent.    I have reviewed this patients PDMP report. There are no concerning findings on this patients report at this time.      Requested Prescriptions     Signed Prescriptions Disp Refills    ALPRAZolam (XANAX) 0.5 MG tablet 10 tablet 0     Sig: Take 0.5-1 tablets (0.25-0.5 mg total) by mouth two (2) times a day as needed for anxiety.     Authorizing Provider: Prisca Gearing, [place] STEVEN       Follow-Up:  Visit date not found     -CM

## 2023-07-13 NOTE — Unmapped (Signed)
[facility] Refill Coordination Note    Specialty Medication(s) to be Shipped:   Inflammatory Disorders: Dupixent    Other medication(s) to be shipped: No additional medications requested for fill at this time     Tracie Taylor, DOB: 26-Feb-1981  Phone: [phone] (home)       All above HIPAA information was verified with patient.     Was a Nurse, learning disability used for this call? No    Completed refill call assessment today to schedule patient's medication shipment from the [facility] Delivery [facility]  [phone]).  All relevant notes have been reviewed.     Specialty medication(s) and dose(s) confirmed: Regimen is correct and unchanged.   Changes to medications: Asmara reports no changes at this time.  Changes to insurance: No  New side effects reported not previously addressed with a pharmacist or physician: None reported  Questions for the pharmacist: No    Confirmed patient received a Conservation officer, historic buildings and a Surveyor, mining with first shipment. The patient will receive a drug information handout for each medication shipped and additional FDA Medication Guides as required.       DISEASE/MEDICATION-SPECIFIC INFORMATION        For patients on injectable medications: Patient currently has 0 doses left.  Next injection is scheduled for asap.    SPECIALTY MEDICATION ADHERENCE     Medication Adherence    Patient reported X missed doses in the last month: 1  Specialty Medication: dupilumab 300 mg/2 mL PnIj  Patient is on additional specialty medications: No  Informant: patient   Other non-adherence reason: missed injection due 1/21                 Were doses missed due to medication being on hold? No    Dupixent 300/2 mg/ml: 0 doses of medicine on hand       REFERRAL TO PHARMACIST     Referral to the pharmacist: Not needed      [facility]     Shipping address confirmed in Epic.       Delivery Scheduled: Yes, Expected medication delivery date: 1/24.     Medication will be delivered via Same Day Courier to the prescription address in Epic [facility].    [facility]

## 2023-07-14 MED FILL — DUPIXENT 300 MG/2 ML SUBCUTANEOUS PEN INJECTOR: SUBCUTANEOUS | 28 days supply | Qty: 4 | Fill #4

## 2023-07-24 NOTE — Unmapped (Signed)
Cervical Cancer Letter sent

## 2023-08-08 NOTE — Unmapped (Signed)
 [facility] Refill Coordination Note    Specialty Medication(s) to be Shipped:   Inflammatory Disorders: Dupixent    Other medication(s) to be shipped: No additional medications requested for fill at this time     Tracie Taylor, DOB: 07-Mar-1981  Phone: [phone] (home)       All above HIPAA information was verified with patient.     Was a Nurse, learning disability used for this call? No    Completed refill call assessment today to schedule patient's medication shipment from the [facility] Delivery Pharmacy  [phone]).  All relevant notes have been reviewed.     Specialty medication(s) and dose(s) confirmed: Regimen is correct and unchanged.   Changes to medications: Brook reports no changes at this time.  Changes to insurance: No  New side effects reported not previously addressed with a pharmacist or physician: None reported  Questions for the pharmacist: No    Confirmed patient received a Conservation officer, historic buildings and a Surveyor, mining with first shipment. The patient will receive a drug information handout for each medication shipped and additional FDA Medication Guides as required.       DISEASE/MEDICATION-SPECIFIC INFORMATION        For patients on injectable medications: Patient currently has 0 doses left.  Next injection is scheduled for 2/25.    SPECIALTY MEDICATION ADHERENCE     Medication Adherence    Patient reported X missed doses in the last month: 0  Specialty Medication: dupilumab 300 mg/2 mL PnIj  Patient is on additional specialty medications: No  Informant: patient              Were doses missed due to medication being on hold? No    Dupixent 300/2 mg/ml: 0 doses of medicine on hand       REFERRAL TO PHARMACIST     Referral to the pharmacist: Not needed      [facility]     Shipping address confirmed in Epic.       Delivery Scheduled: Yes, Expected medication delivery date: 2/24.     Medication will be delivered via Same Day Courier to the prescription address in Epic WAM.    [facility]

## 2023-08-14 MED FILL — DUPIXENT 300 MG/2 ML SUBCUTANEOUS PEN INJECTOR: SUBCUTANEOUS | 28 days supply | Qty: 4 | Fill #5

## 2023-08-30 DIAGNOSIS — F411 Generalized anxiety disorder: Principal | ICD-10-CM

## 2023-08-30 MED ORDER — ALPRAZOLAM 0.5 MG TABLET
ORAL_TABLET | Freq: Two times a day (BID) | ORAL | 0 refills | 0.00 days | PRN
Start: 2023-08-30 — End: ?

## 2023-08-31 MED ORDER — ALPRAZOLAM 0.5 MG TABLET
ORAL_TABLET | Freq: Two times a day (BID) | ORAL | 0 refills | 5.00 days | Status: CP | PRN
Start: 2023-08-31 — End: ?

## 2023-08-31 NOTE — Unmapped (Signed)
 Chart reviewed.  Prescription  sent.    I have reviewed this patients PDMP report. There are no concerning findings on this patients report at this time.      Requested Prescriptions     Signed Prescriptions Disp Refills    ALPRAZolam (XANAX) 0.5 MG tablet 10 tablet 0     Sig: Take 0.5-1 tablets (0.25-0.5 mg total) by mouth two (2) times a day as needed for anxiety.     Authorizing Provider: Prisca Gearing, [place] STEVEN       Follow-Up:  Visit date not found     -CM

## 2023-09-12 NOTE — Unmapped (Signed)
 The [facility] has made a second and final attempt to reach this patient to refill the following medication:Dupixent.      We have left voicemails on the following phone numbers:  [phone], have been unable to leave messages on the following phone numbers: [phone], have sent a text message to the following phone numbers: [phone]    [phone], and have sent a Mychart questionnaire..    Dates contacted: 08/31/2023  09/12/2023  Last scheduled delivery: 08/14/2023    The patient may be at risk of non-compliance with this medication. The patient should call the [facility] at [phone]  Option 4, then Option 2: Dermatology, Gastroenterology, Rheumatology to refill medication.    Tracie Taylor

## 2023-09-20 NOTE — Unmapped (Signed)
 [facility] Delivery Pharm[facility] Refill Coordination Note    Specialty Medication(s) to be Shipped:   Inflammatory Disorders: Dupixent    Other medication(s) to be shipped: No additional medications requested for fill at this time     Tracie Taylor, DOB: [birth year]  Phone: [phone] (home)       All above HIPAA information was verified with patient.     Was a Nurse, learning disability used for this call? No    Completed refill call assessment today to schedule patient's medication shipment from the [facility] Delivery Pharm[facility]  [phone]).  All relevant notes have been reviewed.     Specialty medication(s) and dose(s) confirmed: Regimen is correct and unchanged.   Changes to medications: Tracie Taylor reports no changes at this time.  Changes to insurance: No  New side effects reported not previously addressed with a pharmacist or physician: None reported  Questions for the pharmacist: No    Confirmed patient received a Conservation officer, historic buildings and a Surveyor, mining with first shipment. The patient will receive a drug information handout for each medication shipped and additional FDA Medication Guides as required.       DISEASE/MEDICATION-SPECIFIC INFORMATION        For patients on injectable medications: Patient currently has 0 doses left.  Next injection is scheduled for 09/19/23.    SPECIALTY MEDICATION ADHERENCE     Medication Adherence    Patient reported X missed doses in the last month: 0  Specialty Medication: Dupixent 300 mg/2 mL  Patient is on additional specialty medications: No  Informant: patient     Were doses missed due to medication being on hold? No    Dupixnt 300mg /44mL: 0 doses of medicine on hand       REFERRAL TO PHARMACIST     Referral to the pharmacist: Not needed      [facility]     Shipping address confirmed in Epic.     Cost and Payment: Patient has a $0 copay, payment information is not required.    Delivery Scheduled: Yes, Expected medication delivery date: 09/21/23. Medication will be delivered via Same Day Courier to the prescription address in Epic [place].    Tracie Taylor   [facility]  Specialty Technician

## 2023-09-21 MED FILL — DUPIXENT 300 MG/2 ML SUBCUTANEOUS PEN INJECTOR: SUBCUTANEOUS | 28 days supply | Qty: 4 | Fill #6

## 2023-10-13 NOTE — Unmapped (Signed)
 Duplicate encounter

## 2023-10-13 NOTE — Unmapped (Signed)
 [facility] Delivery Pharm[facility] Refill Coordination Note    Tracie Taylor, [place]: 1980/07/31  Phone: [phone] (home)       All above HIPAA information was verified with patient.         10/13/2023    11:33 AM   Specialty Rx Medication Refill Questionnaire   Which Medications would you like refilled and shipped? Dupixent   Please list all current allergies: Na   Have you missed any doses in the last 30 days? No   Have you had any changes to your medication(s) since your last refill? No   How many days remaining of each medication do you have at home? 0   If receiving an injectable medication, next injection date is 10/17/2023   Have you experienced any side effects in the last 30 days? No   Please enter the full address (street address, city, state, zip code) where you would like your medication(s) to be delivered to. [address], [place] [address]   Please specify on which day you would like your medication(s) to arrive. Note: if you need your medication(s) within 3 days, please call the pharmacy to schedule your order at [phone]  10/16/2023   Has your insurance changed since your last refill? No   Would you like a pharmacist to call you to discuss your medication(s)? No   Do you require a signature for your package? (Note: if we are billing Medicare Part B or your order contains a controlled substance, we will require a signature) No   I have been provided my out of pocket cost for my medication and approve the pharmacy to charge the amount to my credit card on file. No         Completed refill call assessment today to schedule patient's medication shipment from the [facility] and [facility] Delivery Pharmacy [phone]).  All relevant notes have been reviewed.       Confirmed patient received a Conservation officer, historic buildings and a Surveyor, mining with first shipment. The patient will receive a drug information handout for each medication shipped and additional FDA Medication Guides as required.         REFERRAL TO PHARMACIST     Referral to the pharmacist: Not needed      [facility]     Shipping address confirmed in Epic.     Delivery Scheduled: Yes, Expected medication delivery date: 10/16/23.     Medication will be delivered via Same Day Courier to the prescription address in Epic W[facility].    Avis Lemming   [facility] Delivery Pharm[facility] Specialty Technician

## 2023-10-16 MED FILL — DUPIXENT 300 MG/2 ML SUBCUTANEOUS PEN INJECTOR: SUBCUTANEOUS | 28 days supply | Qty: 4 | Fill #7

## 2023-11-14 NOTE — Unmapped (Signed)
 [facility] and Home Delivery Pharmacy Refill Coordination Note    Tracie Taylor, [place]: [birth year]  Phone: [phone] (home)       All above HIPAA information was verified with patient.         11/14/2023    10:35 AM   Specialty Rx Medication Refill Questionnaire   Which Medications would you like refilled and shipped? Dupixant   Please list all current allergies: Na   Have you missed any doses in the last 30 days? No   Have you had any changes to your medication(s) since your last refill? No   How many days remaining of each medication do you have at home? 0   If receiving an injectable medication, next injection date is 11/14/2023   Have you experienced any side effects in the last 30 days? No   Please enter the full address (street address, city, state, zip code) where you would like your medication(s) to be delivered to. [address] [place] [address]   Please specify on which day you would like your medication(s) to arrive. Note: if you need your medication(s) within 3 days, please call the pharmacy to schedule your order at [phone]  11/16/2023   Has your insurance changed since your last refill? No   Would you like a pharmacist to call you to discuss your medication(s)? No   Do you require a signature for your package? (Note: if we are billing Medicare Part B or your order contains a controlled substance, we will require a signature) No   I have been provided my out of pocket cost for my medication and approve the pharmacy to charge the amount to my credit card on file. No   Additional Information Confirm         Completed refill call assessment today to schedule patient's medication shipment from the [facility] and [facility] Delivery Pharmacy [phone]).  All relevant notes have been reviewed.       Confirmed patient received a Conservation officer, historic buildings and a Surveyor, mining with first shipment. The patient will receive a drug information handout for each medication shipped and additional FDA Medication Guides as required.         REFERRAL TO PHARMACIST     Referral to the pharmacist: Not needed      [facility]     Shipping address confirmed in Epic.     Delivery Scheduled: Yes, Expected medication delivery date: 5/29.     Medication will be delivered via Same Day Courier to the prescription address in Epic [facility].    [facility] and [facility]

## 2023-11-16 MED FILL — DUPIXENT 300 MG/2 ML SUBCUTANEOUS PEN INJECTOR: SUBCUTANEOUS | 28 days supply | Qty: 4 | Fill #8

## 2023-12-06 DIAGNOSIS — F411 Generalized anxiety disorder: Principal | ICD-10-CM

## 2023-12-06 DIAGNOSIS — M51369 DDD (degenerative disc disease), lumbar: Principal | ICD-10-CM

## 2023-12-06 MED ORDER — GABAPENTIN 100 MG CAPSULE
ORAL_CAPSULE | Freq: Two times a day (BID) | ORAL | 8 refills | 30.00000 days | PRN
Start: 2023-12-06 — End: ?

## 2023-12-06 MED ORDER — ALPRAZOLAM 0.5 MG TABLET
ORAL_TABLET | Freq: Two times a day (BID) | ORAL | 0 refills | 5.00000 days | PRN
Start: 2023-12-06 — End: ?

## 2023-12-06 NOTE — Unmapped (Signed)
 Pls contact pt for scheduling then route back to provider for refill

## 2023-12-06 NOTE — Unmapped (Signed)
 Chart reviewed.  Prescription NOT sent.    Pt hasn't been seen in over a year. Once schedule will provide short term refill if needed.    Requested Prescriptions     Refused Prescriptions Disp Refills    gabapentin (NEURONTIN) 100 MG capsule [Pharmacy Med Name: GABAPENTIN 100 MG CAPSULE] 120 capsule 8     Sig: Take 1-2 capsules (100-200 mg total) by mouth two (2) times a day as needed.    ALPRAZolam (XANAX) 0.5 MG tablet [Pharmacy Med Name: ALPRAZOLAM 0.5 MG TABLET] 10 tablet 0     Sig: Take 0.5-1 tablets (0.25-0.5 mg total) by mouth two (2) times a day as needed for anxiety.       Follow-Up:  Visit date not found     -CM

## 2023-12-06 NOTE — Unmapped (Signed)
 LVM

## 2023-12-26 DIAGNOSIS — F3342 Major depressive disorder, recurrent, in full remission: Principal | ICD-10-CM

## 2023-12-26 DIAGNOSIS — M51369 DDD (degenerative disc disease), lumbar: Principal | ICD-10-CM

## 2023-12-26 MED ORDER — GABAPENTIN 300 MG CAPSULE
ORAL_CAPSULE | Freq: Every evening | ORAL | 3 refills | 0.00000 days
Start: 2023-12-26 — End: ?

## 2023-12-26 MED ORDER — SERTRALINE 100 MG TABLET
ORAL_TABLET | Freq: Every day | ORAL | 2 refills | 0.00000 days
Start: 2023-12-26 — End: ?

## 2023-12-26 NOTE — Unmapped (Signed)
 [facility] and [facility] Delivery Pharmacy Refill Coordination Note    Specialty Medication(s) to be Shipped:   DUPIXENT  PEN 300 mg/2 mL pen injector (dupilumab )    Other medication(s) to be shipped: No additional medications requested for fill at this time     Tracie Taylor, DOB: [birth year]  Phone: [phone] (home)       All above HIPAA information was verified with patient's family member, husband.     Was a Nurse, learning disability used for this call? No    Completed refill call assessment today to schedule patient's medication shipment from the [facility] Delivery [facility]  [phone]).  All relevant notes have been reviewed.     Specialty medication(s) and dose(s) confirmed: Regimen is correct and unchanged.   Changes to medications: Simon reports no changes at this time.  Changes to insurance: No  New side effects reported not previously addressed with a pharmacist or physician: None reported  Questions for the pharmacist: No    Confirmed patient received a Conservation officer, historic buildings and a Surveyor, mining with first shipment. The patient will receive a drug information handout for each medication shipped and additional FDA Medication Guides as required.       DISEASE/MEDICATION-SPECIFIC INFORMATION        For patients on injectable medications: Patient currently has 0 doses left.  Next injection is scheduled for 12/26/2023.    SPECIALTY MEDICATION ADHERENCE     Medication Adherence    Patient reported X missed doses in the last month: 0  Specialty Medication: dupilumab  (DUPIXENT ) 300 mg/2 mL pen injector  Patient is on additional specialty medications: No              Were doses missed due to medication being on hold? No    DUPIXENT  PEN 300 mg/2 mL pen injector (dupilumab ): 0 doses of medicine on hand     REFERRAL TO PHARMACIST     Referral to the pharmacist: Not needed      [facility]     Shipping address confirmed in Epic.     Cost and Payment: Patient has a $0 copay, payment information is not required.    Delivery Scheduled: Yes, Expected medication delivery date: 12/28/2023.     Medication will be delivered via Same Day Courier to the prescription address in Epic WAM.    Tracie Taylor   [facility]  Specialty Technician

## 2023-12-27 NOTE — Unmapped (Signed)
 LVM

## 2023-12-28 MED FILL — DUPIXENT 300 MG/2 ML SUBCUTANEOUS PEN INJECTOR: SUBCUTANEOUS | 28 days supply | Qty: 4 | Fill #9

## 2023-12-29 MED ORDER — SERTRALINE 100 MG TABLET
ORAL_TABLET | Freq: Every day | ORAL | 0 refills | 60.00000 days | Status: CP
Start: 2023-12-29 — End: ?

## 2023-12-29 MED ORDER — GABAPENTIN 300 MG CAPSULE
ORAL_CAPSULE | Freq: Every evening | ORAL | 0 refills | 30.00000 days | Status: CP
Start: 2023-12-29 — End: ?

## 2023-12-29 NOTE — Unmapped (Signed)
 Chart reviewed.  Prescription  sent.    Short supply only.    Requested Prescriptions     Signed Prescriptions Disp Refills    gabapentin  (NEURONTIN ) 300 MG capsule 90 capsule 0     Sig: Take 2-3 capsules (600-900 mg total) by mouth nightly.     Authorizing Provider: Reis Goga STEVEN    sertraline  (ZOLOFT ) 100 MG tablet 60 tablet 0     Sig: TAKE 1 TABLET BY MOUTH EVERY DAY     Authorizing Provider: Tahiry Spicer STEVEN     Ordering User: LORELI HANDLER H       Follow-Up:  Pt has not been seen in over a year. Needs appt. Would prefer for pt to schedule annual physical and will give more refills during physical    -CM

## 2023-12-29 NOTE — Unmapped (Signed)
 Called pt she said she would call back to schedule once she gets new work schedule

## 2023-12-29 NOTE — Unmapped (Signed)
 Unable to complete refill request, medication failed protocol due to as patient is due for an appointment.  and please advise as the patient is outside the 9 months window.  Please advise on how to proceed.    Patient is requesting the following refill  Requested Prescriptions     Pending Prescriptions Disp Refills    gabapentin  (NEURONTIN ) 300 MG capsule [Pharmacy Med Name: GABAPENTIN  300 MG CAPSULE] 270 capsule 3     Sig: TAKE 2-3 CAPSULES (600-900 MG TOTAL) BY MOUTH NIGHTLY.     Signed Prescriptions Disp Refills    sertraline  (ZOLOFT ) 100 MG tablet 60 tablet 0     Sig: TAKE 1 TABLET BY MOUTH EVERY DAY     Authorizing Provider: MCCAIN, CHAD STEVEN     Ordering User: LORELI HANDLER H       Recent Visits  No visits were found meeting these conditions.  Showing recent visits within past 365 days and meeting all other requirements  Future Appointments  No visits were found meeting these conditions.  Showing future appointments within next 365 days and meeting all other requirements       Labs: Pregnancy:   HCG Urine, POC (no units)   Date Value   02/28/2019 Negative    OBG: Menstrual History:  OB History       Gravida   2    Para   2    Term   2    Preterm        AB        Living   2         SAB        IAB        Ectopic        Molar        Multiple        Live Births   2                 No LMP recorded. Patient has had an implant.

## 2024-01-25 NOTE — Unmapped (Signed)
 [facility] Special[facility] and Home Delivery Pharmacy Refill Coordination Note    Tracie Taylor, [place]: [birth year]  Phone: [phone] (home)       All above HIPAA information was verified with patient.         01/24/2024     9:49 AM   Specialty Rx Medication Refill Questionnaire   Which Medications would you like refilled and shipped? Dupixent  and i have 0 doses   Please list all current allergies: Na   Have you missed any doses in the last 30 days? No   Have you had any changes to your medication(s) since your last refill? No   How much of each medication do you have remaining at home? (eg. number of tablets, injections, etc.) 0   If receiving an injectable medication, next injection date is 01/30/2024   Have you experienced any side effects in the last 30 days? No   Please enter the full address (street address, city, state, zip code) where you would like your medication(s) to be delivered to. [address] [place] [address]   Please specify on which day you would like your medication(s) to arrive. Note: if you need your medication(s) within 3 days, please call the pharmacy to schedule your order at [phone]  01/26/2024   Has your insurance changed since your last refill? No   Would you like a pharmacist to call you to discuss your medication(s)? No   Do you require a signature for your package? (Note: if we are billing Medicare Part B or your order contains a controlled substance, we will require a signature) No   I have been provided my out of pocket cost for my medication and approve the pharmacy to charge the amount to my credit card on file. Yes         Completed refill call assessment today to schedule patient's medication shipment from the [facility] Delivery Pharm[facility] [phone]).  All relevant notes have been reviewed.       Confirmed patient received a Conservation officer, historic buildings and a Surveyor, mining with first shipment. The patient will receive a drug information handout for each medication shipped and additional FDA Medication Guides as required.         REFERRAL TO PHARMACIST     Referral to the pharmacist: Not needed      [facility]     Shipping address confirmed in Epic.     Delivery Scheduled: Yes, Expected medication delivery date: 8/8.     Medication will be delivered via Same Day Courier to the prescription address in Epic WAM.    Jeoffrey JAYSON Sherra [facility]

## 2024-01-26 MED FILL — DUPIXENT 300 MG/2 ML SUBCUTANEOUS PEN INJECTOR: SUBCUTANEOUS | 28 days supply | Qty: 4 | Fill #10

## 2024-02-21 DIAGNOSIS — L2084 Intrinsic (allergic) eczema: Principal | ICD-10-CM

## 2024-02-21 MED ORDER — DUPIXENT 300 MG/2 ML SUBCUTANEOUS PEN INJECTOR
SUBCUTANEOUS | 10 refills | 28.00000 days
Start: 2024-02-21 — End: ?

## 2024-02-22 NOTE — Unmapped (Signed)
 Appointment on 09/18 with Dr. Fanny    Thanks  Charmaine

## 2024-02-28 NOTE — Unmapped (Signed)
 Mammogram letter sent

## 2024-03-01 NOTE — Unmapped (Signed)
 [facility] and Home Delivery Pharmacy Refill Coordination Note    Specialty Medication(s) to be Shipped:   Inflammatory Disorders: Dupixent     Other medication(s) to be shipped: No additional medications requested for fill at this time    Specialty Medications not needed at this time: N/A     Tracie Taylor, DOB: 1980/07/29  Phone: [phone] (home)       All above HIPAA information was verified with patient.     Was a Nurse, learning disability used for this call? No    Completed refill call assessment today to schedule patient's medication shipment from the [facility] Delivery Pharmacy  [phone]).  All relevant notes have been reviewed.     Specialty medication(s) and dose(s) confirmed: Regimen is correct and unchanged.   Changes to medications: [name] reports no changes at this time.  Changes to insurance: No  New side effects reported not previously addressed with a pharmacist or physician: None reported  Questions for the pharmacist: No    Confirmed patient received a Conservation officer, historic buildings and a Surveyor, mining with first shipment. The patient will receive a drug information handout for each medication shipped and additional FDA Medication Guides as required.       DISEASE/MEDICATION-SPECIFIC INFORMATION        For patients on injectable medications: Next injection is scheduled for 03/05/2024.    SPECIALTY MEDICATION ADHERENCE     Medication Adherence    Patient reported X missed doses in the last month: 0  Specialty Medication: DUPIXENT  PEN 300 mg/2 mL pen injector (dupilumab )  Patient is on additional specialty medications: No              Were doses missed due to medication being on hold? No     dupilumab : DUPIXENT  PEN 300 mg/2 mL pen injector: 0 doses of medicine on hand     REFERRAL TO PHARMACIST     Referral to the pharmacist: Not needed      SHIPPING     Shipping address confirmed in Epic.     Cost and Payment: Patient has a $0 copay, payment information is not required.    Delivery Scheduled: Yes, Expected medication delivery date: 03/04/2024.  However, Rx request for refills was sent to the provider as there are none remaining.     Medication will be delivered via Same Day Courier to the prescription address in Epic WAM.    Tracie Taylor   [facility] Delivery Pharm[facility]  Specialty Technician

## 2024-03-04 NOTE — Unmapped (Signed)
 Tracie Taylor 's Dupixent  shipment will be delayed as a result of no refills remain on the prescription.      I have reached out to the patient  at [phone] and communicated the delay. We will call the patient back to reschedule the delivery upon resolution. We have not confirmed the new delivery date.

## 2024-03-04 NOTE — Unmapped (Signed)
 Tracie Taylor 's Dupixent  shipment will be canceled as a result of no response for refills after 3 attempts to provider.     I have reached out to the patient  at [phone] and communicated the delay. We will not reschedule the medication and have removed this/these medication(s) from the work request.  We have canceled this work request.

## 2024-03-07 DIAGNOSIS — L811 Chloasma: Principal | ICD-10-CM

## 2024-03-07 DIAGNOSIS — L2084 Intrinsic (allergic) eczema: Principal | ICD-10-CM

## 2024-03-07 DIAGNOSIS — L814 Other melanin hyperpigmentation: Principal | ICD-10-CM

## 2024-03-07 MED ORDER — DUPILUMAB 300 MG/2 ML SUBCUTANEOUS PEN INJECTOR
SUBCUTANEOUS | 10 refills | 28.00000 days | Status: CP
Start: 2024-03-07 — End: ?
  Filled 2024-03-08: qty 4, 28d supply, fill #0

## 2024-03-07 MED ORDER — TACROLIMUS 0.1 % TOPICAL OINTMENT
Freq: Two times a day (BID) | TOPICAL | 1 refills | 30.00000 days | Status: CP
Start: 2024-03-07 — End: ?

## 2024-03-07 MED ORDER — CLOBETASOL 0.05 % TOPICAL CREAM
OPHTHALMIC | 5 refills | 0.00000 days | Status: CP
Start: 2024-03-07 — End: ?

## 2024-03-07 MED ORDER — EMPTY CONTAINER
2 refills | 0.00000 days | Status: CP
Start: 2024-03-07 — End: ?

## 2024-03-07 NOTE — Unmapped (Signed)
 Tracie Taylor 's DUPIXENT  PEN 300 mg/2 mL pen injector (dupilumab ) shipment will be rescheduled as a result of a new prescription for the medication has been received.      I have reached out to the patient  at [phone] and communicated the delivery change. We will reschedule the medication for the delivery date that the patient agreed upon.  We have confirmed the delivery date as 03/08/24

## 2024-03-07 NOTE — Unmapped (Signed)
 Dermatology Note     Assessment and Plan:      Atopic dermatitis, BSA 2%, significant improved and stable on dupilumab  with some mild flaring over the past two weeks without medication: chronic, flaring   chronic, improved but not at treatment goal  - Previously tried triamcinolone , clobetasol , tacrolimus , systemic corticosteroids  - Diagnosis, treatment options, prognosis, risk/ benefit, and side effects of treatment were discussed with the patient.   - CONTINUE  clobetasol  (TEMOVATE ) 0.05 % cream; Apply the medication twice daily to affected areas of the skin until smooth. Then stop and re-start as soon as the skin changes come back.  - CONTINUE  tacrolimus  (PROTOPIC ) 0.1 % ointment; Apply 1 Application topically two (2) times a day. Apply to eczema flares on face/neck or sensitive skin areas.  - CONTINUE  dupilumab  (DUPIXENT ) 300 mg/2 mL pen injector; Inject the contents of 1 pen (300 mg total) under the skin every fourteen (14) days. Maintenance dose.  - Sent prescription for sharps disposal container for dupixent ; empty container Misc; Use as directed to dispose of Dupixent  pens.  - Reviewed proper use of topical steroids to minimize the risk of steroid-induced skin changes.   - Also discussed appropriate dry skin care including daily warm baths with gentle soap, followed by liberal bland moisturizer application.   - A patient education handout reiterating this information was provided.    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   - Agreed to continue treatment for eczema as above given additional benefit for prurigo nodularis    Melasma and lentigines on cheeks   - Diagnosis, prognosis, treatment options including prescription medications, risks/benefits, and side effects were discussed with the patient.   - Encouraged sunscreen use and sun protection  - Deferring active treatments for now given skin sensitivity and active eczema flare on face.    The patient was advised to call for an appointment should any new, changing, or symptomatic lesions develop.     RTC: Return in about 1 year (around 03/07/2025) for In-person. or sooner as needed   _________________________________________________________________      Chief Complaint     Chief Complaint   Patient presents with    Medication Refill     Nummular eczema dupixent         HPI     Tracie Taylor is a 43 y.o. female who presents as a returning patient (last seen 03/21/2023) to Dermatology for follow up of atopic dermatitis. Patient was previously treated with dupilumab  (Dupixent ) and clobetasol  0.05% cream. Today she refers her eczema has been well controlled with dupixent  and has only started having some flaring since last week while not having her medication.     Skin Lesion of Concern  Location: left lower lateral leg  Duration: months  Character: itchy to tender sometimes  Pain or tendernes: Yes sometimes  Bleeding: No  Pruritus: Yes often  Treatment(s): None    The patient denies any other new or changing lesions or areas of concern.     Pertinent Past Medical History     Generalized anxiety disorder managed by PCP with sertraline   Chronic back pain managed by PCP with gabapentin   History of alcohol use with decreased intake to 1-3 drinks per week on chart review  Problem List       Eczema    Relevant Medications    clobetasol  (TEMOVATE ) 0.05 % cream    tacrolimus  (PROTOPIC ) 0.1 % ointment    dupilumab  (DUPIXENT ) 300 mg/2 mL pen injector    empty container Misc     Family History:   Mother has psoriasis    Past Medical History, Family History, Social History, Medication List, Allergies, and Problem List were reviewed in the rooming section of Epic.     ROS: Other than symptoms mentioned in the HPI, no fevers, chills, or other skin complaints    Physical Examination     GENERAL: Well-appearing female in no acute distress, resting comfortably.  NEURO: Alert and oriented, answers questions appropriately  PSYCH: Normal mood and affect  SKIN: Examination of the face, bilateral upper extremities, and bilateral lower extremities was performed  - Diffuse xerosis with erythematous plaques on the on the leg and right cheek, erythema, and pigment alteration focally.  -  Nodular lesion with hyperpigmentation and erythema and slight scale.  Evidence of excoriation. Many across the bilateral legs (including lesion of concern)  -  6-20 mm pigmented macules that are tan to brown in color and are somewhat non-uniform in shape and concentrated in the face    - small hyperpigmented macules and patches with coalescence located symmetrically on the bilateral cheeks      All areas not commented on are within normal limits or unremarkable  - facial exam limited by makeup    (Approved Template 03/02/2020)

## 2024-03-18 ENCOUNTER — Ambulatory Visit: Admission: EM | Admit: 2024-03-18 | Discharge: 2024-03-18 | Disposition: A | Discharge status: Home / Self Care

## 2024-03-18 DIAGNOSIS — H9203 Otalgia, bilateral: Secondary | ICD-10-CM | POA: Insufficient documentation

## 2024-03-18 DIAGNOSIS — J039 Acute tonsillitis, unspecified: Secondary | ICD-10-CM | POA: Diagnosis not present

## 2024-03-18 DIAGNOSIS — G43909 Migraine, unspecified, not intractable, without status migrainosus: Secondary | ICD-10-CM | POA: Insufficient documentation

## 2024-03-18 DIAGNOSIS — Z87891 Personal history of nicotine dependence: Secondary | ICD-10-CM | POA: Insufficient documentation

## 2024-03-18 DIAGNOSIS — H0100B Unspecified blepharitis left eye, upper and lower eyelids: Secondary | ICD-10-CM | POA: Insufficient documentation

## 2024-03-18 DIAGNOSIS — Z9081 Acquired absence of spleen: Secondary | ICD-10-CM | POA: Insufficient documentation

## 2024-03-18 LAB — GROUP A STREP BY PCR: Group A Strep by PCR: NOT DETECTED

## 2024-03-18 MED ORDER — ERYTHROMYCIN 5 MG/GM OP OINT: TOPICAL_OINTMENT | OPHTHALMIC | 0 refills | Status: AC

## 2024-03-18 MED ORDER — AMOXICILLIN-POT CLAVULANATE 875-125 MG PO TABS: 1.0000 | ORAL_TABLET | Freq: Two times a day (BID) | ORAL | 0 refills | Status: AC

## 2024-03-18 NOTE — Discharge Instructions (Addendum)
 Perform eyelid hygiene twice daily with baby shampoo worked into a Psychiatric nurse. Scrub your eyelids 10 times and rinse thoroughly.  Following the eyelid hygiene, using a clean Q-tip, apply a 1 inch ribbon of antibiotic ointment to your eyelash margin twice daily for the first week and then once daily at bedtime for an additional 2 weeks.  You may apply warm compresses to your eye for 20-minute at a time, 2-3 times a day, to help with pain and inflammation.  If you develop any increased redness to your eyelid, swelling, pain, change in vision, or pus drainage you need to follow-up with ophthalmology.   Take the Augmentin twice daily for 7 days for treatment of your exudative tonsillitis.  Gargle with warm salt water 2-3 times a day to soothe your throat, aid in pain relief, and aid in healing.  Take over-the-counter Tylenol and/or ibuprofen according to the package instructions as needed for pain.  You can also use Chloraseptic or Sucrets lozenges, 1 lozenge every 2 hours as needed for throat pain.  If you develop any new or worsening symptoms return for reevaluation.

## 2024-03-18 NOTE — ED Triage Notes (Signed)
 Pt c/o L eye redness & drainage x4 days. Has tried warm compresses w/o relief.  Also c/o sore throat & bilateral ear pain x1 wk. Has tried OTC meds w/o relief.

## 2024-03-18 NOTE — ED Provider Notes (Signed)
 [facility]    CSN: [id number] Arrival date & time: 03/18/24  1317      History   Chief Complaint Chief Complaint  Patient presents with   Conjunctivitis   Sore Throat   Otalgia    HPI Julie Leach is a 43 y.o. female.   HPI  43-year female with past medical history significant for migraine headaches, chronic back pain, anxiety, and elevated platelet count presents for evaluation of ocular and respiratory symptoms.  Her eye complaint involves itchiness, redness, and mucousy drainage from the left eye for the last 4 days.  This morning when she woke up she noticed that her eye was matted shut.  She denies any contact with others with pinkeye symptoms and she does not wear contact lenses but she does wear glasses.  Her respiratory complaints are a sore throat and pain in both of her ears that been going for the past week.  No associated fever, runny nose, nasal congestion.  She has had an infrequent, nonproductive cough that she attributes more to a clearing of the throat because her throat hurts.  No shortness of breath or wheezing.  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      Home Medications    Prior to Admission medications   Medication Sig Start Date End Date Taking? Authorizing Provider  ALPRAZolam (XANAX) 0.5 MG tablet Take 0.5-1 tablets (0.25-0.5 mg total) by mouth two (2) times a day as needed for anxiety. 08/31/23  Yes [provider]  amoxicillin-clavulanate (AUGMENTIN) 875-125 MG tablet Take 1 tablet by mouth every 12 (twelve) hours for 7 days. 03/18/24 03/25/24 Yes Bernardino Ditch, NP  clobetasol cream (TEMOVATE) 0.05 % Apply the medication twice daily to affected areas of the skin until smooth. Then stop and re-start as soon as the skin changes come back. 03/07/24  Yes [provider]  Dupilumab 300 MG/2ML SOAJ Inject the contents of 1 pen (300 mg total) under the skin every fourteen (14) days. Maintenance dose. 03/07/24  Yes [provider]  erythromycin ophthalmic ointment Place a 1/2 inch ribbon of ointment into the lower eyelid. 03/18/24  Yes Bernardino Ditch, NP  tacrolimus (PROTOPIC) 0.1 % ointment Apply 1 Application topically two (2) times a day. Apply to eczema flares on face/neck or sensitive skin areas. 03/07/24  Yes [provider]  gabapentin (NEURONTIN) 300 MG capsule Take 300 mg by mouth 3 (three) times daily.    [provider]  levonorgestrel (MIRENA) 20 MCG/24HR IUD 1 each by Intrauterine  route once.    [provider]  sertraline (ZOLOFT) 100 MG tablet Take 100 mg by mouth daily.    [provider]    Family History Family History  Problem Relation Age of Onset   Cancer Mother 31       unknown primary bones,lung,and liver    Social History Social History   Tobacco Use   Smoking status: Former    Current packs/day: 0.00    Types: Cigarettes    Quit date: 02/20/2008    Years since quitting: 16.0   Smokeless tobacco: Never  Vaping Use   Vaping status: Never Used  Substance Use  Topics   Alcohol use: Yes   Drug use: Never     Allergies   Morphine and codeine, Latex, and Meloxicam   Review of Systems Review of Systems  Constitutional:  Negative for fever.  HENT:  Positive for ear pain and sore throat. Negative for congestion and rhinorrhea.   Eyes:  Positive for pain, discharge, redness and itching. Negative for photophobia and visual disturbance.  Respiratory:  Positive for cough. Negative for shortness of breath and wheezing.      Physical Exam Triage Vital Signs ED Triage Vitals [03/18/24 1343]  Encounter Vitals Group     BP      Girls Systolic BP Percentile      Girls Diastolic BP Percentile      Boys Systolic BP Percentile      Boys Diastolic BP Percentile      Pulse      Resp 16     Temp      Temp Source Oral     SpO2      Weight      Height      Head Circumference      Peak Flow      Pain Score      Pain Loc      Pain Education      Exclude from Growth Chart    No data found.  Updated Vital Signs BP 132/85 (BP Location: Right Arm)   Pulse 75   Temp 98.7 F (37.1 C) (Oral)   Resp 16   Wt 143 lb 4.8 oz (65 kg)   SpO2 95%   BMI 26.21 kg/m   Visual Acuity Right Eye Distance: 20/25 Left Eye Distance: 20/40 Bilateral Distance: 20/25 (w/correction)  Right Eye Near:   Left Eye Near:    Bilateral Near:     Physical Exam Vitals and nursing note reviewed.  Constitutional:      Appearance: Normal appearance. She is not ill-appearing.  HENT:     Head: Normocephalic and atraumatic.     Right Ear: Tympanic membrane, ear canal and external ear normal. There is no impacted cerumen.     Left Ear: Tympanic membrane, ear canal and external ear normal. There is no impacted cerumen.     Mouth/Throat:     Mouth: Mucous membranes are moist.     Pharynx: Oropharyngeal exudate and posterior oropharyngeal erythema present.     Comments: Tonsillar pillars are edematous, erythematous, and covered in white exudate. Cardiovascular:      Rate and Rhythm: Normal rate and regular rhythm.     Pulses: Normal pulses.     Heart sounds: Normal heart sounds. No murmur heard.    No friction rub. No gallop.  Pulmonary:     Effort: Pulmonary effort is normal.     Breath sounds: Normal breath sounds. No wheezing, rhonchi  or rales.  Musculoskeletal:     Cervical back: Normal range of motion and neck supple. No tenderness.  Lymphadenopathy:     Cervical: No cervical adenopathy.  Skin:    General: Skin is warm and dry.     Capillary Refill: Capillary refill takes less than 2 seconds.     Findings: No rash.  Neurological:     General: No focal deficit present.     Mental Status: She is alert and oriented to person, place, and time.      UC Treatments / Results  Labs (all labs ordered are listed, but only abnormal results are displayed) Labs Reviewed  GROUP A STREP BY PCR    EKG   Radiology No results found.  Procedures Procedures (including critical care time)  Medications Ordered in UC Medications - No data to display  Initial Impression / Assessment and Plan / UC Course  I have reviewed the triage vital signs and the nursing notes.  Pertinent labs & imaging results that were available during my care of the patient were reviewed by me and considered in my medical decision making (see chart for details).   Patient is a pleasant, nontoxic-appearing 43 year old female presenting for evaluation of ocular and respiratory symptoms as outlined in HPI above.  With regards to her ocular symptoms, she has been experiencing left eye redness, swelling, and drainage for the last 4 days.  She describes her eyes being itchy and having mucousy discharge mostly in the mornings.  This morning when she woke up her eye was matted shut.  She has also noticed progressive swelling of her upper and lower eyelids over the past several days.  Patient was seen image above, there is mild edema and erythema to the upper eyelid as well as mild  erythema to the medial aspect of the lower eyelid without overt edema.  Pupils equal round reactive and EOM's intact.  Bulbar and labral conjunctiva are unremarkable.  The patient exam is more consistent with blepharitis.  I will start her on erythromycin eye ointment twice daily for the first week followed by once daily at bedtime for 2 additional weeks for treatment of blepharitis along with warm compresses and eyelid hygiene.  Visual acuity OU 20/25, OD 20/25, OS 20/40.  With regards to her respiratory symptoms she is complaining of pain in both of her ears and a sore throat.  On exam patient's tympanic membranes are pearly gray bilaterally with normal light reflex and both EACs are clear.  Her oropharyngeal exam does reveal edematous and erythematous tonsillar pillars covered in white exudate.  No cervical lymphadenopathy present.  Cardiopulmonary exam is benign.  Differential diagnosis includes strep pharyngitis, exudative tonsillitis, viral pharyngitis I will order a strep PCR.  Strep PCR is negative.  I will discharge patient home with diagnosis of exudative tonsillitis and blepharitis.  I will start her on erythromycin for the blepharitis and Augmentin 875 mg twice daily with food for 7 days for treatment of her exudative tonsillitis.  Return precautions reviewed.  Work note provided.   Final Clinical Impressions(s) / UC Diagnoses   Final diagnoses:  Blepharitis of both upper and lower eyelid of left eye, unspecified type  Exudative tonsillitis     Discharge Instructions      Perform eyelid hygiene twice daily with baby shampoo worked into a Psychiatric nurse. Scrub your eyelids 10 times and rinse thoroughly.  Following the eyelid hygiene, using a clean Q-tip, apply a 1 inch ribbon of antibiotic ointment to  your eyelash margin twice daily for the first week and then once daily at bedtime for an additional 2 weeks.  You may apply warm compresses to your eye for 20-minute at a time, 2-3 times  a day, to help with pain and inflammation.  If you develop any increased redness to your eyelid, swelling, pain, change in vision, or pus drainage you need to follow-up with ophthalmology.   Take the Augmentin twice daily for 7 days for treatment of your exudative tonsillitis.  Gargle with warm salt water 2-3 times a day to soothe your throat, aid in pain relief, and aid in healing.  Take over-the-counter Tylenol and/or ibuprofen according to the package instructions as needed for pain.  You can also use Chloraseptic or Sucrets lozenges, 1 lozenge every 2 hours as needed for throat pain.  If you develop any new or worsening symptoms return for reevaluation.      ED Prescriptions     Medication Sig Dispense Auth. Provider   erythromycin ophthalmic ointment Place a 1/2 inch ribbon of ointment into the lower eyelid. 3.5 g Bernardino Ditch, NP   amoxicillin-clavulanate (AUGMENTIN) 875-125 MG tablet Take 1 tablet by mouth every 12 (twelve) hours for 7 days. 14 tablet Bernardino Ditch, NP      PDMP not reviewed this encounter.   Bernardino Ditch, NP 03/18/24 1434

## 2024-04-16 DIAGNOSIS — F411 Generalized anxiety disorder: Principal | ICD-10-CM

## 2024-04-16 DIAGNOSIS — Z23 Encounter for immunization: Principal | ICD-10-CM

## 2024-04-16 DIAGNOSIS — F3342 Major depressive disorder, recurrent, in full remission: Principal | ICD-10-CM

## 2024-04-16 DIAGNOSIS — M51369 DDD (degenerative disc disease), lumbar: Principal | ICD-10-CM

## 2024-04-16 DIAGNOSIS — Z1231 Encounter for screening mammogram for malignant neoplasm of breast: Principal | ICD-10-CM

## 2024-04-16 MED ORDER — GABAPENTIN 300 MG CAPSULE
ORAL_CAPSULE | Freq: Every evening | ORAL | 2 refills | 30.00000 days | Status: CP
Start: 2024-04-16 — End: 2024-04-16

## 2024-04-16 MED ORDER — GABAPENTIN 100 MG CAPSULE
ORAL_CAPSULE | Freq: Two times a day (BID) | ORAL | 1 refills | 68.00000 days | Status: CP | PRN
Start: 2024-04-16 — End: ?

## 2024-04-16 MED ORDER — ALPRAZOLAM 0.5 MG TABLET
ORAL_TABLET | Freq: Two times a day (BID) | ORAL | 0 refills | 5.00000 days | Status: CP | PRN
Start: 2024-04-16 — End: ?

## 2024-04-16 MED ORDER — SERTRALINE 100 MG TABLET
ORAL_TABLET | Freq: Every day | ORAL | 1 refills | 90.00000 days | Status: CP
Start: 2024-04-16 — End: ?

## 2024-04-16 NOTE — Progress Notes (Signed)
 Patient in clinic for office visit, Bexsero - Meningococcal B, FluLaval - 6 months and up Flu Vaccine, and Covid 19 - Pfizer 12+ Comirnaty vaccine administered per [employer] and/or EPIC chart reviewed and vaccine accuracy verified with teammates: Jacqulynn Bruckner, CMA, patient eligible to receive vacstock: Private Vaccine, vaccine administered Right Deltoid and Left Deltoid, pt tolerated well, no s/s of a reaction noted, VIS given to patient  .

## 2024-04-17 NOTE — Progress Notes (Signed)
 [facility] Refill Coordination Note    Specialty Medication(s) to be Shipped:   Inflammatory Disorders: Dupixent     Other medication(s) to be shipped: No additional medications requested for fill at this time    Specialty Medications not needed at this time: N/A     Tracie Taylor, DOB: Jul 15, 1980  Phone: [phone] (home)       All above HIPAA information was verified with patient.     Was a nurse, learning disability used for this call? No    Completed refill call assessment today to schedule patient's medication shipment from the [facility]  [phone]).  All relevant notes have been reviewed.     Specialty medication(s) and dose(s) confirmed: Regimen is correct and unchanged.   Changes to medications: [name] reports no changes at this time.  Changes to insurance: No  New side effects reported not previously addressed with a pharmacist or physician: None reported  Questions for the pharmacist: No    Confirmed patient received a Conservation Officer, Historic Buildings and a Surveyor, Mining with first shipment. The patient will receive a drug information handout for each medication shipped and additional FDA Medication Guides as required.       DISEASE/MEDICATION-SPECIFIC INFORMATION        For patients on injectable medications: Next injection is scheduled for 04/16/24.    SPECIALTY MEDICATION ADHERENCE     Medication Adherence    Patient reported X missed doses in the last month: 0  Specialty Medication: DUPIXENT  PEN 300 mg/2 mL pen injector (dupilumab )  Patient is on additional specialty medications: No              Were doses missed due to medication being on hold? No     DUPIXENT  PEN 300 mg/2 mL pen injector (dupilumab ): 0 days of medicine on hand       REFERRAL TO PHARMACIST     Referral to the pharmacist: Not needed      [facility]     Shipping address confirmed in Epic.     Cost and Payment: Patient has a $0 copay, payment information is not required.    Delivery Scheduled: Yes, Expected medication delivery date: 04/18/24.     Medication will be delivered via Same Day Courier to the prescription address in Epic WAM.    Tracie Taylor   [facility]  Specialty Technician

## 2024-04-18 MED FILL — DUPIXENT 300 MG/2 ML SUBCUTANEOUS PEN INJECTOR: SUBCUTANEOUS | 28 days supply | Qty: 4 | Fill #1
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
## 2024-05-10 NOTE — Progress Notes (Signed)
 Tracie Taylor reports her Dupixent  continues to work well - she's had no recent flaring and only rarely needs topicals.     [facility] Clinical Assessment & Refill Coordination Note    Tracie Taylor, Tracie Taylor: 1981-03-23  Phone: [phone] (home)     All above HIPAA information was verified with patient.     Was a nurse, learning disability used for this call? No    Specialty Medication(s):   Inflammatory Disorders: Dupixent      Current Medications[1]     Changes to medications: Tracie Taylor reports no changes at this time.    Medication list has been reviewed and updated in Epic: Yes    Allergies[2]    Changes to allergies: No    Allergies have been reviewed and updated in Epic: Yes    SPECIALTY MEDICATION ADHERENCE     Dupixent  300 mg: 0 doses of medicine on hand     Medication Adherence    Patient reported X missed doses in the last month: 0  Specialty Medication: Dupixent           Specialty medication(s) dose(s) confirmed: Regimen is correct and unchanged.     Are there any concerns with adherence? No    Adherence counseling provided? Not needed    CLINICAL MANAGEMENT AND INTERVENTION      Clinical Benefit Assessment:    Do you feel the medicine is effective or helping your condition? Yes    Clinical Benefit counseling provided? Not needed    Adverse Effects Assessment:    Are you experiencing any side effects? No    Are you experiencing difficulty administering your medicine? No    Quality of Life Assessment:    Quality of Life    Rheumatology  Oncology  Dermatology  1. What impact has your specialty medication had on the symptoms of your skin condition (i.e. itchiness, soreness, stinging)?: Tremendous  2. What impact has your specialty medication had on your comfort level with your skin?: Tremendous  Cystic Fibrosis          How many days over the past month did your AD  keep you from your normal activities? For example, brushing your teeth or getting up in the morning. 0    Have you discussed this with your provider? Not needed    Acute Infection Status:    Acute infections noted within Epic:  No active infections    Patient reported infection: None    Therapy Appropriateness:    Is the medication and dose appropriate considering the patient???s diagnosis, treatment, and disease journey, comorbidities, medical history, current medications, allergies, therapeutic goals, self-administration ability, and access barriers? Yes, therapy is appropriate and should be continued     Clinical Intervention:    Was an intervention completed as part of this clinical assessment? No    DISEASE/MEDICATION-SPECIFIC INFORMATION      For patients on injectable medications: Next injection is scheduled for 12/2.    Chronic Inflammatory Diseases: Have you experienced any flares in the last month? No  Has this been reported to your provider? No    PATIENT SPECIFIC NEEDS     Does the patient have any physical, cognitive, or cultural barriers? No    Is the patient high risk? No    Does the patient require physician intervention or other additional services (i.e., nutrition, smoking cessation, social work)? No    Does the patient have an additional or emergency contact listed in their chart? Yes    SOCIAL DETERMINANTS  OF HEALTH     At the [facility], we have learned that life circumstances - like trouble affording food, housing, utilities, or transportation can affect the health of many of our patients.   That is why we wanted to ask: are you currently experiencing any life circumstances that are negatively impacting your health and/or quality of life? Patient declined to answer    Social Drivers of Health     Food Insecurity: No Food Insecurity (09/15/2021)    Hunger Vital Sign     Worried About Running Out of Food in the Last Year: Never true     Ran Out of Food in the Last Year: Never true   Tobacco Use: Medium Risk (04/16/2024)    Patient History     Smoking Tobacco Use: Former     Smokeless Tobacco Use: Never     Passive Exposure: Not on file   Transportation Needs: No Transportation Needs (09/15/2021)    PRAPARE - Therapist, Art (Medical): No     Lack of Transportation (Non-Medical): No   Alcohol Use: Not on file   Housing: Unknown (09/17/2022)    Housing     Within the past 12 months, have you ever stayed: outside, in a car, in a tent, in an overnight shelter, or temporarily in someone else's home (i.e. couch-surfing)?: No     Are you worried about losing your housing?: Not on file   Physical Activity: Not on file   Utilities: Not on file   Stress: Not on file   Interpersonal Safety: Not on file   Substance Use: Not on file (04/26/2023)   Intimate Partner Violence: Not on file   Social Connections: Not on file   Financial Resource Strain: Low Risk (09/15/2021)    Overall Financial Resource Strain (CARDIA)     Difficulty of Paying Living Expenses: Not hard at all   Health Literacy: Low Risk (09/11/2021)    Health Literacy     : Never   Internet Connectivity: Not on file       Would you be willing to receive help with any of the needs that you have identified today? Not applicable       SHIPPING     Specialty Medication(s) to be Shipped:   Inflammatory Disorders: Dupixent     Other medication(s) to be shipped: No additional medications requested for fill at this time    Specialty Medications not needed at this time: N/A     Changes to insurance: No    Cost and Payment: Patient has a $0 copay, payment information is not required.    Delivery Scheduled: Yes, Expected medication delivery date: 11/26.     Medication will be delivered via Same Day Courier to the confirmed prescription address in [place].    The patient will receive a drug information handout for each medication shipped and additional FDA Medication Guides as required.  Verified that patient has previously received a Conservation Officer, Historic Buildings and a Surveyor, Mining.    The patient or caregiver noted above participated in the development of this care plan and knows that they can request review of or adjustments to the care plan at any time.      All of the patient's questions and concerns have been addressed.    [facility] Specialty Pharmacist         [1]   Current Outpatient Medications  Medication Sig Dispense Refill    ALPRAZolam  (XANAX ) 0.5 MG tablet Take 0.5-1 tablets (0.25-0.5 mg total) by mouth two (2) times a day as needed for anxiety. 10 tablet 0    clobetasol  (TEMOVATE ) 0.05 % cream Apply the medication twice daily to affected areas of the skin until smooth. Then stop and re-start as soon as the skin changes come back. 60 g 5    dupilumab  (DUPIXENT ) 300 mg/2 mL pen injector Inject the contents of 1 pen (300 mg total) under the skin every fourteen (14) days. Maintenance dose. 4 mL 10    empty container Misc Use as directed to dispose of Dupixent  pens. 1 each 2    gabapentin  (NEURONTIN ) 100 MG capsule Take 1-2 capsules (100-200 mg total) by mouth two (2) times a day as needed. 270 capsule 1    gabapentin  (NEURONTIN ) 300 MG capsule Take 2-3 capsules (600-900 mg total) by mouth nightly. 270 capsule 1    levonorgestrel (MIRENA) 20 mcg/24 hr (5 years) IUD 1 each by Intrauterine route once. (Patient not taking: Reported on 03/07/2024)      sertraline  (ZOLOFT ) 100 MG tablet Take 1 tablet (100 mg total) by mouth daily. 90 tablet 1    tacrolimus  (PROTOPIC ) 0.1 % ointment Apply 1 Application topically two (2) times a day. Apply to eczema flares on face/neck or sensitive skin areas. 30 g 1     No current facility-administered medications for this visit.   [2]   Allergies  Allergen Reactions    Latex Rash    Meloxicam Other (See Comments) and Nausea Only     GI distress    Morphine Itching and Nausea Only    Tramadol Other (See Comments) and Nausea Only     GI distress

## 2024-05-15 MED FILL — DUPIXENT 300 MG/2 ML SUBCUTANEOUS PEN INJECTOR: SUBCUTANEOUS | 28 days supply | Qty: 4 | Fill #2

## 2024-06-05 DIAGNOSIS — L2084 Intrinsic (allergic) eczema: Principal | ICD-10-CM

## 2024-06-10 NOTE — Progress Notes (Signed)
 [facility] Refill Coordination Note    Specialty Medication(s) to be Shipped:   Inflammatory Disorders: Dupixent     Other medication(s) to be shipped: No additional medications requested for fill at this time    Specialty Medications not needed at this time: N/A     Tracie Taylor, DOB: 01-Jul-1980  Phone: [phone] (home)       All above HIPAA information was verified with patient.     Was a nurse, learning disability used for this call? No    Completed refill call assessment today to schedule patient's medication shipment from the [facility]  [phone]).  All relevant notes have been reviewed.     Specialty medication(s) and dose(s) confirmed: Regimen is correct and unchanged.   Changes to medications: Zan reports no changes at this time.  Changes to insurance: No  New side effects reported not previously addressed with a pharmacist or physician: None reported  Questions for the pharmacist: No    Confirmed patient received a Conservation Officer, Historic Buildings and a Surveyor, Mining with first shipment. The patient will receive a drug information handout for each medication shipped and additional FDA Medication Guides as required.       DISEASE/MEDICATION-SPECIFIC INFORMATION        N/A    SPECIALTY MEDICATION ADHERENCE     Medication Adherence    Specialty Medication: DUPIXENT  PEN 300 mg/2 mL pen injector (dupilumab )  Patient is on additional specialty medications: No              Were doses missed due to medication being on hold? No     DUPIXENT  PEN 300 mg/2 mL pen injector (dupilumab ): 0 doses of medicine on hand       Specialty medication is an injection or given on a cycle: Yes, Next injection is scheduled for 06/18/24.    REFERRAL TO PHARMACIST     Referral to the pharmacist: Not needed      [facility]     Shipping address confirmed in Epic.     Cost and Payment: Patient has a $0 copay, payment information is not required.    Delivery Scheduled: Yes, Expected medication delivery date: 06/14/24.     Medication will be delivered via same day courier to the prescription address in Epic W[facility].    Tracie Taylor   [facility]  Specialty Technician

## 2024-06-14 MED FILL — DUPIXENT 300 MG/2 ML SUBCUTANEOUS PEN INJECTOR: SUBCUTANEOUS | 28 days supply | Qty: 4 | Fill #3

## 2024-07-24 NOTE — Progress Notes (Signed)
 [facility] Refill Coordination Note    Specialty Medication(s) to be Shipped:   Inflammatory Disorders: Dupixent     Other medication(s) to be shipped: No additional medications requested for fill at this time    Specialty Medications not needed at this time: N/A     Tracie Taylor, DOB: 25-Feb-1981  Phone: [phone] (home)       All above HIPAA information was verified with patient.     Was a nurse, learning disability used for this call? No    Completed refill call assessment today to schedule patient's medication shipment from the [facility]  [phone]).  All relevant notes have been reviewed.     Specialty medication(s) and dose(s) confirmed: Regimen is correct and unchanged.   Changes to medications: [name] reports no changes at this time.  Changes to insurance: No  New side effects reported not previously addressed with a pharmacist or physician: None reported  Questions for the pharmacist: No    Confirmed patient received a Conservation Officer, Historic Buildings and a Surveyor, Mining with first shipment. The patient will receive a drug information handout for each medication shipped and additional FDA Medication Guides as required.       DISEASE/MEDICATION-SPECIFIC INFORMATION        N/A    SPECIALTY MEDICATION ADHERENCE     Medication Adherence    Patient reported X missed doses in the last month: 0  Specialty Medication: DUPIXENT  PEN 300 mg/2 mL pen injector (dupilumab )  Patient is on additional specialty medications: No              Were doses missed due to medication being on hold? No     DUPIXENT  PEN 300 mg/2 mL pen injector (dupilumab ): 0 doses of medicine on hand         Specialty medication is an injection or given on a cycle: Yes, Next injection is scheduled for 08/06/24.    REFERRAL TO PHARMACIST     Referral to the pharmacist: Not needed      [facility]     Shipping address confirmed in Epic.     Cost and Payment: Patient has a $0 copay, payment information is not required.    Delivery Scheduled: Yes, Expected medication delivery date: 07/31/24.     Medication will be delivered via Next Day Courier to the prescription address in Epic WAM.    Tracie Taylor   [place] [facility]  Specialty Technician
